# Patient Record
Sex: Female | Born: 1985 | Race: Black or African American | Hispanic: No | Marital: Single | Smoking: Current every day smoker
Health system: Southern US, Community
[De-identification: ages and names within clinical notes are randomized; demographics above are authoritative.]

## PROBLEM LIST (undated history)

## (undated) DIAGNOSIS — I10 Essential (primary) hypertension: Secondary | ICD-10-CM

---

## 2012-04-18 ENCOUNTER — Encounter (HOSPITAL_BASED_OUTPATIENT_CLINIC_OR_DEPARTMENT_OTHER): Payer: Self-pay | Admitting: *Deleted

## 2012-04-18 ENCOUNTER — Emergency Department (HOSPITAL_BASED_OUTPATIENT_CLINIC_OR_DEPARTMENT_OTHER)
Admission: EM | Admit: 2012-04-18 | Discharge: 2012-04-18 | Discharge status: Against Medical Advice | Payer: Self-pay | Attending: Emergency Medicine | Admitting: Emergency Medicine

## 2012-04-18 DIAGNOSIS — R51 Headache: Secondary | ICD-10-CM | POA: Insufficient documentation

## 2012-04-18 DIAGNOSIS — R509 Fever, unspecified: Secondary | ICD-10-CM | POA: Insufficient documentation

## 2012-04-18 DIAGNOSIS — R05 Cough: Secondary | ICD-10-CM | POA: Insufficient documentation

## 2012-04-18 DIAGNOSIS — R059 Cough, unspecified: Secondary | ICD-10-CM | POA: Insufficient documentation

## 2012-04-18 DIAGNOSIS — IMO0001 Reserved for inherently not codable concepts without codable children: Secondary | ICD-10-CM | POA: Insufficient documentation

## 2012-04-18 HISTORY — DX: Essential (primary) hypertension: I10

## 2012-04-18 NOTE — ED Notes (Signed)
Patient states she has had cold symptoms for one week, developed generalized bodyache, headache, fever and productive cough with dark secretions.

## 2012-04-18 NOTE — ED Notes (Signed)
No answer in waiting room 

## 2012-04-19 ENCOUNTER — Encounter (HOSPITAL_BASED_OUTPATIENT_CLINIC_OR_DEPARTMENT_OTHER): Payer: Self-pay | Admitting: Emergency Medicine

## 2012-04-19 ENCOUNTER — Emergency Department (HOSPITAL_BASED_OUTPATIENT_CLINIC_OR_DEPARTMENT_OTHER)
Admission: EM | Admit: 2012-04-19 | Discharge: 2012-04-19 | Disposition: A | Discharge status: Home / Self Care | Payer: Self-pay | Attending: Emergency Medicine | Admitting: Emergency Medicine

## 2012-04-19 ENCOUNTER — Emergency Department (HOSPITAL_BASED_OUTPATIENT_CLINIC_OR_DEPARTMENT_OTHER): Payer: Self-pay

## 2012-04-19 DIAGNOSIS — I10 Essential (primary) hypertension: Secondary | ICD-10-CM | POA: Insufficient documentation

## 2012-04-19 DIAGNOSIS — J3489 Other specified disorders of nose and nasal sinuses: Secondary | ICD-10-CM | POA: Insufficient documentation

## 2012-04-19 DIAGNOSIS — R059 Cough, unspecified: Secondary | ICD-10-CM | POA: Insufficient documentation

## 2012-04-19 DIAGNOSIS — R509 Fever, unspecified: Secondary | ICD-10-CM | POA: Insufficient documentation

## 2012-04-19 DIAGNOSIS — R5381 Other malaise: Secondary | ICD-10-CM | POA: Insufficient documentation

## 2012-04-19 DIAGNOSIS — R042 Hemoptysis: Secondary | ICD-10-CM | POA: Insufficient documentation

## 2012-04-19 DIAGNOSIS — R197 Diarrhea, unspecified: Secondary | ICD-10-CM | POA: Insufficient documentation

## 2012-04-19 DIAGNOSIS — J029 Acute pharyngitis, unspecified: Secondary | ICD-10-CM | POA: Insufficient documentation

## 2012-04-19 DIAGNOSIS — Z3202 Encounter for pregnancy test, result negative: Secondary | ICD-10-CM | POA: Insufficient documentation

## 2012-04-19 DIAGNOSIS — R05 Cough: Secondary | ICD-10-CM | POA: Insufficient documentation

## 2012-04-19 DIAGNOSIS — J111 Influenza due to unidentified influenza virus with other respiratory manifestations: Secondary | ICD-10-CM

## 2012-04-19 DIAGNOSIS — IMO0001 Reserved for inherently not codable concepts without codable children: Secondary | ICD-10-CM | POA: Insufficient documentation

## 2012-04-19 LAB — URINALYSIS, ROUTINE W REFLEX MICROSCOPIC
Glucose, UA: NEGATIVE mg/dL
Ketones, ur: NEGATIVE mg/dL
Leukocytes, UA: NEGATIVE
Protein, ur: NEGATIVE mg/dL

## 2012-04-19 MED ORDER — ALBUTEROL SULFATE HFA 108 (90 BASE) MCG/ACT IN AERS
2.0000 | INHALATION_SPRAY | RESPIRATORY_TRACT | Status: DC | PRN
  Administered 2012-04-19: 2 via RESPIRATORY_TRACT
  Filled 2012-04-19: qty 6.7

## 2012-04-19 MED ORDER — HYDROCOD POLST-CHLORPHEN POLST 10-8 MG/5ML PO LQCR
5.0000 mL | Freq: Once | ORAL | Status: AC
  Administered 2012-04-19: 5 mL via ORAL
  Filled 2012-04-19: qty 5

## 2012-04-19 MED ORDER — HYDROCOD POLST-CHLORPHEN POLST 10-8 MG/5ML PO LQCR: 5.0000 mL | Freq: Two times a day (BID) | ORAL | Status: DC | PRN

## 2012-04-19 NOTE — ED Notes (Signed)
Prescription called in to [facility] at [phone] for dextromethorphan 10mg  po q4h prn, no refills per [employer], pa-c

## 2012-04-19 NOTE — ED Notes (Signed)
Body aches  Cough  fever

## 2012-04-19 NOTE — ED Provider Notes (Signed)
History     CSN: [id number]  Arrival date & time 04/19/12  0035   First MD Initiated Contact with Patient 04/19/12 0049      Chief Complaint  Patient presents with  . Fever    (Consider location/radiation/quality/duration/timing/severity/associated sxs/prior treatment) HPI This is 27 year old female who developed cold symptoms about a week and a half ago. These symptoms included cough, nasal congestion and scratchy throat.  Her symptoms improved but over the last 2 days she has developed fever as high as 101, generalized body aches which been severe at times, increased cough productive of clear sputum and occasionally blood-streaked sputum, and general malaise. She's been taking over-the-counter medications without relief. She's had diarrhea but no nausea or vomiting.   Past Medical History  Diagnosis Date  . Hypertension     History reviewed. No pertinent past surgical history.  No family history on file.  History  Substance Use Topics  . Smoking status: Never Smoker   . Smokeless tobacco: Not on file  . Alcohol Use: Yes     Comment: occassionally    OB History    Grav Para Term Preterm Abortions TAB SAB Ect Mult Living                  Review of Systems  All other systems reviewed and are negative.    Allergies  Review of patient's allergies indicates no known allergies.  Home Medications  No current outpatient prescriptions on file.  LMP 03/18/2012  Physical Exam General: Well-developed, well-nourished female in no acute distress; appearance consistent with age of record HENT: normocephalic, atraumatic; nasal congestion; no pharyngeal erythema or exudate Eyes: pupils equal round and reactive to light; extraocular muscles intact Neck: supple Heart: regular rate and rhythm; tachycardia Lungs: clear to auscultation bilaterally; paroxysms of cough, particularly when attempting a deep breath Abdomen: soft; nondistended Extremities: No deformity; full range  of motion Neurologic: Awake, alert and oriented; motor function intact in all extremities and symmetric; no facial droop Skin: Warm and dry Psychiatric: Flat affect    ED Course  Procedures (including critical care time)     MDM  Nursing notes and vitals signs, including pulse oximetry, reviewed.  Summary of this visit's results, reviewed by myself:  Imaging Studies: Dg Chest 2 View  04/19/2012  *RADIOLOGY REPORT*  Clinical Data: Cough, chest pain, hypertension.  CHEST - 2 VIEW  Comparison: None.  Findings: Lungs clear.  Heart size and pulmonary vascularity normal.  No effusion.  Visualized bones unremarkable.  No pneumothorax.  IMPRESSION: No acute disease   Original Report Authenticated By: D. Andria Rhein, MD             Hanley Seamen, MD 04/19/12 [phone]

## 2012-07-27 ENCOUNTER — Emergency Department (HOSPITAL_BASED_OUTPATIENT_CLINIC_OR_DEPARTMENT_OTHER)
Admission: EM | Admit: 2012-07-27 | Discharge: 2012-07-27 | Disposition: A | Discharge status: Home / Self Care | Payer: Self-pay | Attending: Emergency Medicine | Admitting: Emergency Medicine

## 2012-07-27 ENCOUNTER — Encounter (HOSPITAL_BASED_OUTPATIENT_CLINIC_OR_DEPARTMENT_OTHER): Payer: Self-pay | Admitting: *Deleted

## 2012-07-27 DIAGNOSIS — I10 Essential (primary) hypertension: Secondary | ICD-10-CM | POA: Insufficient documentation

## 2012-07-27 DIAGNOSIS — Y939 Activity, unspecified: Secondary | ICD-10-CM | POA: Insufficient documentation

## 2012-07-27 DIAGNOSIS — S025XXA Fracture of tooth (traumatic), initial encounter for closed fracture: Secondary | ICD-10-CM | POA: Insufficient documentation

## 2012-07-27 DIAGNOSIS — X58XXXA Exposure to other specified factors, initial encounter: Secondary | ICD-10-CM | POA: Insufficient documentation

## 2012-07-27 DIAGNOSIS — K0889 Other specified disorders of teeth and supporting structures: Secondary | ICD-10-CM

## 2012-07-27 DIAGNOSIS — Y929 Unspecified place or not applicable: Secondary | ICD-10-CM | POA: Insufficient documentation

## 2012-07-27 DIAGNOSIS — R6884 Jaw pain: Secondary | ICD-10-CM | POA: Insufficient documentation

## 2012-07-27 DIAGNOSIS — R221 Localized swelling, mass and lump, neck: Secondary | ICD-10-CM | POA: Insufficient documentation

## 2012-07-27 DIAGNOSIS — R220 Localized swelling, mass and lump, head: Secondary | ICD-10-CM | POA: Insufficient documentation

## 2012-07-27 MED ORDER — HYDROCODONE-ACETAMINOPHEN 5-325 MG PO TABS: 2.0000 | ORAL_TABLET | ORAL | Status: DC | PRN

## 2012-07-27 MED ORDER — AMOXICILLIN 500 MG PO CAPS: 500.0000 mg | ORAL_CAPSULE | Freq: Three times a day (TID) | ORAL | Status: DC

## 2012-07-27 NOTE — ED Notes (Signed)
PA at bedside.

## 2012-07-27 NOTE — ED Provider Notes (Signed)
History     CSN: [id number]  Arrival date & time 07/27/12  1230   First MD Initiated Contact with Patient 07/27/12 1320      Chief Complaint  Patient presents with  . Dental Pain    (Consider location/radiation/quality/duration/timing/severity/associated sxs/prior treatment) Patient is a 27 y.o. female presenting with tooth pain. The history is provided by the patient. No language interpreter was used.  Dental PainThe primary symptoms include mouth pain and dental injury. The symptoms began more than 1 week ago. The symptoms are worsening. The symptoms are new. The symptoms occur constantly.  Additional symptoms include: jaw pain and facial swelling.    Past Medical History  Diagnosis Date  . Hypertension     History reviewed. No pertinent past surgical history.  History reviewed. No pertinent family history.  History  Substance Use Topics  . Smoking status: Never Smoker   . Smokeless tobacco: Not on file  . Alcohol Use: Yes     Comment: occassionally    OB History   Grav Para Term Preterm Abortions TAB SAB Ect Mult Living                  Review of Systems  HENT: Positive for facial swelling and dental problem.   All other systems reviewed and are negative.    Allergies  Review of patient's allergies indicates no known allergies.  Home Medications   Current Outpatient Rx  Name  Route  Sig  Dispense  Refill  . amoxicillin (AMOXIL) 500 MG capsule   Oral   Take 1 capsule (500 mg total) by mouth 3 (three) times daily.   21 capsule   0   . chlorpheniramine-HYDROcodone (TUSSIONEX PENNKINETIC ER) 10-8 MG/5ML LQCR   Oral   Take 5 mLs by mouth every 12 (twelve) hours as needed (for cough).   115 mL   0   . HYDROcodone-acetaminophen (NORCO/VICODIN) 5-325 MG per tablet   Oral   Take 2 tablets by mouth every 4 (four) hours as needed.   16 tablet   0     BP 125/86  Pulse 84  Temp(Src) 98.5 F (36.9 C) (Oral)  Resp 18  Ht 5\' 3"  (1.6 m)  Wt 135 lb  (61.236 kg)  BMI 23.92 kg/m2  SpO2 100%  LMP 06/18/2012  Physical Exam  Nursing note and vitals reviewed. Constitutional: She appears well-developed and well-nourished.  HENT:  Head: Normocephalic.  Swollen gum right lower, broken tooth  Eyes: Pupils are equal, round, and reactive to light.  Cardiovascular: Normal rate.   Pulmonary/Chest: Effort normal.  Musculoskeletal: Normal range of motion.  Neurological: She is alert.  Skin: Skin is warm.  Psychiatric: She has a normal mood and affect.    ED Course  Procedures (including critical care time)  Labs Reviewed - No data to display No results found.   1. Toothache       MDM  amoxicillian and hydrocodone        Elson Areas, [place] 07/27/12 1349

## 2012-07-27 NOTE — ED Notes (Signed)
Pt c/o toothache x 1 week.

## 2012-07-28 NOTE — ED Provider Notes (Signed)
Medical screening examination/treatment/procedure(s) were performed by non-physician practitioner and as supervising physician I was immediately available for consultation/collaboration.   Shelda Jakes, MD 07/28/12 [phone]

## 2015-02-09 ENCOUNTER — Encounter (HOSPITAL_BASED_OUTPATIENT_CLINIC_OR_DEPARTMENT_OTHER): Payer: Self-pay | Admitting: Emergency Medicine

## 2015-02-09 ENCOUNTER — Emergency Department (HOSPITAL_BASED_OUTPATIENT_CLINIC_OR_DEPARTMENT_OTHER)
Admission: EM | Admit: 2015-02-09 | Discharge: 2015-02-09 | Disposition: A | Discharge status: Home / Self Care | Payer: Self-pay | Attending: Emergency Medicine | Admitting: Emergency Medicine

## 2015-02-09 DIAGNOSIS — I10 Essential (primary) hypertension: Secondary | ICD-10-CM | POA: Insufficient documentation

## 2015-02-09 DIAGNOSIS — L509 Urticaria, unspecified: Secondary | ICD-10-CM | POA: Insufficient documentation

## 2015-02-09 DIAGNOSIS — Z720 Tobacco use: Secondary | ICD-10-CM | POA: Insufficient documentation

## 2015-02-09 MED ORDER — PREDNISONE 50 MG PO TABS
60.0000 mg | ORAL_TABLET | Freq: Once | ORAL | Status: AC
  Administered 2015-02-09: 60 mg via ORAL
  Filled 2015-02-09 (×2): qty 1

## 2015-02-09 MED ORDER — DIPHENHYDRAMINE HCL 25 MG PO TABS: 25.0000 mg | ORAL_TABLET | Freq: Four times a day (QID) | ORAL | Status: DC

## 2015-02-09 MED ORDER — DIPHENHYDRAMINE HCL 25 MG PO CAPS
25.0000 mg | ORAL_CAPSULE | Freq: Once | ORAL | Status: AC
  Administered 2015-02-09: 25 mg via ORAL
  Filled 2015-02-09: qty 1

## 2015-02-09 MED ORDER — PREDNISONE 10 MG PO TABS: 20.0000 mg | ORAL_TABLET | Freq: Every day | ORAL | Status: DC

## 2015-02-09 NOTE — Discharge Instructions (Signed)
Hives Hives are itchy, red, swollen areas of the skin. They can vary in size and location on your body. Hives can come and go for hours or several days (acute hives) or for several weeks (chronic hives). Hives do not spread from person to person (noncontagious). They may get worse with scratching, exercise, and emotional stress. CAUSES   Allergic reaction to food, additives, or drugs.  Infections, including the common cold.  Illness, such as vasculitis, lupus, or thyroid disease.  Exposure to sunlight, heat, or cold.  Exercise.  Stress.  Contact with chemicals. SYMPTOMS   Red or white swollen patches on the skin. The patches may change size, shape, and location quickly and repeatedly.  Itching.  Swelling of the hands, feet, and face. This may occur if hives develop deeper in the skin. DIAGNOSIS  Your caregiver can usually tell what is wrong by performing a physical exam. Skin or blood tests may also be done to determine the cause of your hives. In some cases, the cause cannot be determined. TREATMENT  Mild cases usually get better with medicines such as antihistamines. Severe cases may require an emergency epinephrine injection. If the cause of your hives is known, treatment includes avoiding that trigger.  HOME CARE INSTRUCTIONS   Avoid causes that trigger your hives.  Take antihistamines as directed by your caregiver to reduce the severity of your hives. Non-sedating or low-sedating antihistamines are usually recommended. Do not drive while taking an antihistamine.  Take any other medicines prescribed for itching as directed by your caregiver.  Wear loose-fitting clothing.  Keep all follow-up appointments as directed by your caregiver. SEEK MEDICAL CARE IF:   You have persistent or severe itching that is not relieved with medicine.  You have painful or swollen joints. SEEK IMMEDIATE MEDICAL CARE IF:   You have a fever.  Your tongue or lips are swollen.  You have  trouble breathing or swallowing.  You feel tightness in the throat or chest.  You have abdominal pain. These problems may be the first sign of a life-threatening allergic reaction. Call your local emergency services (911 in [place] MAKE SURE YOU:   Understand these instructions.  Will watch your condition.  Will get help right away if you are not doing well or get worse.   This information is not intended to replace advice given to you by your health care provider. Make sure you discuss any questions you have with your health care provider.   Document Released: 03/20/2005 Document Revised: 03/25/2013 Document Reviewed: 06/13/2011 Elsevier Interactive Patient Education 2016 [employer]  

## 2015-02-09 NOTE — ED Notes (Signed)
Rash   Onset yesterday

## 2015-02-09 NOTE — ED Provider Notes (Signed)
CSN: [id number]     Arrival date & time 02/09/15  0800 History   First MD Initiated Contact with Patient 02/09/15 [phone]     Chief Complaint  Patient presents with  . Rash     (Consider location/radiation/quality/duration/timing/severity/associated sxs/prior Treatment) HPI  29 year old female presents today complaining of rash that began yesterday morning. She describes it as red, raised, and itchy. It began on her lower extremities but has migrated and includes her arms and some of her trunk now. It is migrating in appearance. There is no mucous membrane involvement.  Past Medical History  Diagnosis Date  . Hypertension    History reviewed. No pertinent past surgical history. No family history on file. Social History  Substance Use Topics  . Smoking status: Light Tobacco Smoker  . Smokeless tobacco: None  . Alcohol Use: Yes     Comment: occassionally   OB History    No data available     Review of Systems  All other systems reviewed and are negative.     Allergies  Review of patient's allergies indicates no known allergies.  Home Medications   Prior to Admission medications   Medication Sig Start Date End Date Taking? Authorizing Provider  diphenhydrAMINE (BENADRYL) 25 MG tablet Take 1 tablet (25 mg total) by mouth every 6 (six) hours. 02/09/15   Margarita Grizzleanielle Florie Carico, MD  predniSONE (DELTASONE) 10 MG tablet Take 2 tablets (20 mg total) by mouth daily. 02/09/15   Margarita Grizzleanielle Taneesha Edgin, MD   BP 133/94 mmHg  Pulse 110  Temp(Src) 98.9 F (37.2 C) (Oral)  Resp 18  Ht 5\' 3"  (1.6 m)  Wt 153 lb 1.6 oz (69.446 kg)  BMI 27.13 kg/m2  SpO2 100% Physical Exam  Constitutional: She is oriented to person, place, and time. She appears well-developed and well-nourished.  HENT:  Head: Normocephalic and atraumatic.  Right Ear: External ear normal.  Left Ear: External ear normal.  Nose: Nose normal.  Mouth/Throat: Oropharynx is clear and moist.  Eyes: Pupils are equal, round, and reactive to  light.  Neck: Normal range of motion.  Cardiovascular: Normal rate and regular rhythm.   Pulmonary/Chest: Effort normal and breath sounds normal.  Abdominal: Soft.  Musculoskeletal: Normal range of motion. She exhibits no edema or tenderness.  Neurological: She is alert and oriented to person, place, and time. She has normal reflexes.  Skin: Skin is warm. Rash noted. Rash is urticarial.     Nursing note and vitals reviewed.   ED Course  Procedures (including critical care time) Labs Review Labs Reviewed - No data to display  Imaging Review No results found. I have personally reviewed and evaluated these images and lab results as part of my medical decision-making.   EKG Interpretation None      MDM   Final diagnoses:  Hives    Patient with exam and history consistent with hives. She has no apparent involvement of other symptoms such as GI or respiratory. She has had some similar symptoms in the past and has not been able to figure out the etiology. She normally takes Benadryl with relief. Has not taken any this time. I discussed with her that she should use Benadryl and I'm placing her on prednisone. She is given referral for primary care and allergy and asthma. She voices understanding of return precautions and need for follow-up.    Margarita Grizzleanielle Michaila Kenney, MD 02/09/15 [phone]

## 2016-10-13 ENCOUNTER — Emergency Department (HOSPITAL_COMMUNITY)
Admission: EM | Admit: 2016-10-13 | Discharge: 2016-10-13 | Disposition: A | Discharge status: Home / Self Care | Payer: Self-pay | Attending: Emergency Medicine | Admitting: Emergency Medicine

## 2016-10-13 ENCOUNTER — Encounter (HOSPITAL_COMMUNITY): Payer: Self-pay | Admitting: Emergency Medicine

## 2016-10-13 ENCOUNTER — Emergency Department (HOSPITAL_COMMUNITY): Payer: Self-pay

## 2016-10-13 DIAGNOSIS — R102 Pelvic and perineal pain: Secondary | ICD-10-CM | POA: Insufficient documentation

## 2016-10-13 DIAGNOSIS — N83209 Unspecified ovarian cyst, unspecified side: Secondary | ICD-10-CM

## 2016-10-13 DIAGNOSIS — A599 Trichomoniasis, unspecified: Secondary | ICD-10-CM

## 2016-10-13 DIAGNOSIS — N730 Acute parametritis and pelvic cellulitis: Secondary | ICD-10-CM

## 2016-10-13 DIAGNOSIS — N83202 Unspecified ovarian cyst, left side: Secondary | ICD-10-CM | POA: Insufficient documentation

## 2016-10-13 DIAGNOSIS — F17210 Nicotine dependence, cigarettes, uncomplicated: Secondary | ICD-10-CM | POA: Insufficient documentation

## 2016-10-13 LAB — COMPREHENSIVE METABOLIC PANEL
ALBUMIN: 4.7 g/dL (ref 3.5–5.0)
ALT: 14 U/L (ref 14–54)
ANION GAP: 9 (ref 5–15)
AST: 20 U/L (ref 15–41)
Alkaline Phosphatase: 66 U/L (ref 38–126)
BUN: 9 mg/dL (ref 6–20)
CO2: 23 mmol/L (ref 22–32)
Calcium: 9.4 mg/dL (ref 8.9–10.3)
Chloride: 106 mmol/L (ref 101–111)
Creatinine, Ser: 0.77 mg/dL (ref 0.44–1.00)
GFR calc Af Amer: >60 mL/min (ref >60)
GFR calc non Af Amer: >60 mL/min (ref >60)
GLUCOSE: 87 mg/dL (ref 65–99)
POTASSIUM: 3.4 mmol/L — AB (ref 3.5–5.1)
SODIUM: 138 mmol/L (ref 135–145)
TOTAL PROTEIN: 8 g/dL (ref 6.5–8.1)
Total Bilirubin: 0.5 mg/dL (ref 0.3–1.2)

## 2016-10-13 LAB — CBC
HEMATOCRIT: 40.1 % (ref 36.0–46.0)
HEMOGLOBIN: 14.1 g/dL (ref 12.0–15.0)
MCH: 28.1 pg (ref 26.0–34.0)
MCHC: 35.2 g/dL (ref 30.0–36.0)
MCV: 80 fL (ref 78.0–100.0)
Platelets: 205 10*3/uL (ref 150–400)
RBC: 5.01 MIL/uL (ref 3.87–5.11)
RDW: 13.3 % (ref 11.5–15.5)
WBC: 10.7 10*3/uL — ABNORMAL HIGH (ref 4.0–10.5)

## 2016-10-13 LAB — WET PREP, GENITAL
Sperm: NONE SEEN
Yeast Wet Prep HPF POC: NONE SEEN

## 2016-10-13 LAB — RAPID HIV SCREEN (HIV 1/2 AB+AG)
HIV 1/2 ANTIBODIES: NONREACTIVE
HIV-1 P24 ANTIGEN - HIV24: NONREACTIVE

## 2016-10-13 LAB — URINALYSIS, ROUTINE W REFLEX MICROSCOPIC
BACTERIA UA: NONE SEEN
Bilirubin Urine: NEGATIVE
Glucose, UA: NEGATIVE mg/dL
HGB URINE DIPSTICK: NEGATIVE
KETONES UR: 5 mg/dL — AB
NITRITE: NEGATIVE
PROTEIN: NEGATIVE mg/dL
Specific Gravity, Urine: 1.006 (ref 1.005–1.030)
pH: 7 (ref 5.0–8.0)

## 2016-10-13 LAB — I-STAT BETA HCG BLOOD, ED (MC, WL, AP ONLY)

## 2016-10-13 LAB — LIPASE, BLOOD: LIPASE: 14 U/L (ref 11–51)

## 2016-10-13 MED ORDER — DOXYCYCLINE HYCLATE 100 MG PO TABS
100.0000 mg | ORAL_TABLET | Freq: Once | ORAL | Status: AC
Start: 2016-10-13 — End: 2016-10-13
  Administered 2016-10-13: 100 mg via ORAL
  Filled 2016-10-13: qty 1

## 2016-10-13 MED ORDER — KETOROLAC TROMETHAMINE 15 MG/ML IJ SOLN
15.0000 mg | Freq: Once | INTRAMUSCULAR | Status: AC
  Administered 2016-10-13: 15 mg via INTRAVENOUS
  Filled 2016-10-13: qty 1

## 2016-10-13 MED ORDER — DOXYCYCLINE HYCLATE 100 MG PO CAPS: 100.0000 mg | ORAL_CAPSULE | Freq: Two times a day (BID) | ORAL | 0 refills | Status: AC

## 2016-10-13 MED ORDER — NAPROXEN 500 MG PO TABS: 500.0000 mg | ORAL_TABLET | Freq: Two times a day (BID) | ORAL | 0 refills | Status: AC | PRN

## 2016-10-13 MED ORDER — HYDROCODONE-ACETAMINOPHEN 5-325 MG PO TABS
2.0000 | ORAL_TABLET | Freq: Once | ORAL | Status: AC
  Administered 2016-10-13: 2 via ORAL
  Filled 2016-10-13: qty 2

## 2016-10-13 MED ORDER — FLUCONAZOLE 150 MG PO TABS: 150.0000 mg | ORAL_TABLET | Freq: Every day | ORAL | 0 refills | Status: AC

## 2016-10-13 MED ORDER — DEXTROSE 5 % IV SOLN
1.0000 g | Freq: Once | INTRAVENOUS | Status: AC
  Administered 2016-10-13: 1 g via INTRAVENOUS
  Filled 2016-10-13: qty 10

## 2016-10-13 MED ORDER — MORPHINE SULFATE (PF) 4 MG/ML IV SOLN
6.0000 mg | Freq: Once | INTRAVENOUS | Status: AC
  Administered 2016-10-13: 6 mg via INTRAVENOUS
  Filled 2016-10-13: qty 2

## 2016-10-13 MED ORDER — METRONIDAZOLE 500 MG PO TABS
2000.0000 mg | ORAL_TABLET | Freq: Once | ORAL | Status: AC
  Administered 2016-10-13: 2000 mg via ORAL
  Filled 2016-10-13: qty 4

## 2016-10-13 MED ORDER — SODIUM CHLORIDE 0.9 % IV BOLUS (SEPSIS)
1000.0000 mL | Freq: Once | INTRAVENOUS | Status: AC
  Administered 2016-10-13: 1000 mL via INTRAVENOUS

## 2016-10-13 MED ORDER — HYDROCODONE-ACETAMINOPHEN 5-325 MG PO TABS
1.0000 | ORAL_TABLET | Freq: Four times a day (QID) | ORAL | 0 refills | Status: AC | PRN
Start: 2016-10-13 — End: ?

## 2016-10-13 MED ORDER — ONDANSETRON HCL 4 MG/2ML IJ SOLN
4.0000 mg | Freq: Once | INTRAMUSCULAR | Status: AC
  Administered 2016-10-13: 4 mg via INTRAVENOUS
  Filled 2016-10-13: qty 2

## 2016-10-13 NOTE — ED Provider Notes (Signed)
[facility]-EMERGENCY DEPT Provider Note   CSN: [id number] Arrival date & time: 10/13/16  0848     History   Chief Complaint Chief Complaint  Patient presents with  . Abdominal Pain    HPI Debbie Baxter is a 31 y.o. female.  HPI   31 yo F with PMHx HTN, ovarian cysts here with severe lower abdominal pain. Pt was in her usual state of health on awakening this AM. While getting ready for work, she developed acute onset of sharp, severe, lower abdominal pain with radiation to her vagina and rectum. She has had no vaginal bleeding or discharge prior to or since the onset of pain. No dysuria, frequency, or hematuria. The pain is midline primarily, worse with any movement or palpation. No alleviating factors. Denies recent high risk sexual activity. Her LMP ended 2 weeks ago.  Past Medical History:  Diagnosis Date  . Hypertension     There are no active problems to display for this patient.   History reviewed. No pertinent surgical history.  OB History    No data available       Home Medications    Prior to Admission medications   Medication Sig Start Date End Date Taking? Authorizing Provider  Biotin 1000 MCG CHEW Chew 2 each by mouth daily.   Yes [provider]  doxycycline (VIBRAMYCIN) 100 MG capsule Take 1 capsule (100 mg total) by mouth 2 (two) times daily. 10/13/16 10/27/16  Shaune PollackIsaacs, Mazzie Brodrick, MD  fluconazole (DIFLUCAN) 150 MG tablet Take 1 tablet (150 mg total) by mouth daily. For yeast infection, after finishing antibiotics 10/13/16 10/14/16  Shaune PollackIsaacs, Richardo Popoff, MD  HYDROcodone-acetaminophen (NORCO/VICODIN) 5-325 MG tablet Take 1-2 tablets by mouth every 6 (six) hours as needed. 10/13/16   Shaune PollackIsaacs, [place] Depaz, MD  naproxen (NAPROSYN) 500 MG tablet Take 1 tablet (500 mg total) by mouth 2 (two) times daily as needed for moderate pain. 10/13/16 10/20/16  Shaune PollackIsaacs, Ashleigh Luckow, MD    Family History History reviewed. No pertinent family history.  Social History Social History    Substance Use Topics  . Smoking status: Light Tobacco Smoker  . Smokeless tobacco: Not on file  . Alcohol use Yes     Comment: occassionally     Allergies   Patient has no known allergies.   Review of Systems Review of Systems  Constitutional: Positive for fatigue.  Gastrointestinal: Positive for abdominal pain and nausea.  Genitourinary: Positive for pelvic pain and vaginal pain.  All other systems reviewed and are negative.    Physical Exam Updated Vital Signs BP 122/87 (BP Location: Left Arm)   Pulse 87   Temp 98.9 F (37.2 C) (Oral)   Resp 16   SpO2 99%   Physical Exam  Constitutional: She is oriented to person, place, and time. She appears well-developed and well-nourished. She appears distressed.  Tearful, in pain  HENT:  Head: Normocephalic and atraumatic.  Eyes: Conjunctivae are normal.  Neck: Neck supple.  Cardiovascular: Regular rhythm and normal heart sounds.  Tachycardia present.  Exam reveals no friction rub.   No murmur heard. Pulmonary/Chest: Effort normal and breath sounds normal. No respiratory distress. She has no wheezes. She has no rales.  Abdominal: She exhibits no distension. There is tenderness in the suprapubic area. There is guarding. There is no rigidity, no rebound, no tenderness at McBurney's point and negative Murphy's sign.  Genitourinary:  Genitourinary Comments: Moderate volume white discharge in vaginal vault. Os closed, with significant CMT. Diffuse suprapubic TTP but no overt adnexal TTP  or fullness.  Musculoskeletal: She exhibits no edema.  Neurological: She is alert and oriented to person, place, and time. She exhibits normal muscle tone.  Skin: Skin is warm. Capillary refill takes less than 2 seconds.  Psychiatric: She has a normal mood and affect.  Nursing note and vitals reviewed.    ED Treatments / Results  Labs (all labs ordered are listed, but only abnormal results are displayed) Labs Reviewed  WET PREP, GENITAL -  Abnormal; Notable for the following:       Result Value   Trich, Wet Prep PRESENT (*)    Clue Cells Wet Prep HPF POC PRESENT (*)    WBC, Wet Prep HPF POC FEW (*)    All other components within normal limits  COMPREHENSIVE METABOLIC PANEL - Abnormal; Notable for the following:    Potassium 3.4 (*)    All other components within normal limits  CBC - Abnormal; Notable for the following:    WBC 10.7 (*)    All other components within normal limits  URINALYSIS, ROUTINE W REFLEX MICROSCOPIC - Abnormal; Notable for the following:    Ketones, ur 5 (*)    Leukocytes, UA MODERATE (*)    Squamous Epithelial / LPF 0-5 (*)    All other components within normal limits  LIPASE, BLOOD  RAPID HIV SCREEN (HIV 1/2 AB+AG)  HIV ANTIBODY (ROUTINE TESTING)  I-STAT BETA HCG BLOOD, ED ([facility], [facility], [facility] ONLY)  GC/CHLAMYDIA PROBE AMP ([facility]) NOT AT [facility]    EKG  EKG Interpretation None       Radiology US Transvaginal Non-ob  Result Date: 10/13/2016 CLINICAL DATA:  Severe lower abdominal pain, pelvic pain EXAM: TRANSABDOMINAL AND TRANSVAGINAL ULTRASOUND OF PELVIS TECHNIQUE: Both transabdominal and transvaginal ultrasound examinations of the pelvis were performed. Transabdominal technique was performed for global imaging of the pelvis including uterus, ovaries, adnexal regions, and pelvic cul-de-sac. It was necessary to proceed with endovaginal exam following the transabdominal exam to visualize the uterus, endometrium, ovaries and adnexa . COMPARISON:  None FINDINGS: Uterus Measurements: 7.9 x 3.4 x 5.3 cm. No fibroids or other mass visualized. Endometrium Thickness: 12 mm in thickness.  No focal abnormality visualized. Right ovary Measurements: 2.6 x 2.1 x 2.3 cm. Normal appearance/no adnexal mass. Multiple small follicles. Left ovary Measurements: 4.6 x 2.3 x 3.0 cm complex cystic area within the left ovary measures up to 2.8 cm, likely small hemorrhagic cyst or follicle. Other findings Small amount of free  fluid. IMPRESSION: Small hemorrhagic sister follicle in the left ovary. No acute findings or significant abnormality. Electronically Signed   By: Charlett Nose M.D.   On: 10/13/2016 11:59   US Pelvis Complete  Result Date: 10/13/2016 CLINICAL DATA:  Severe lower abdominal pain, pelvic pain EXAM: TRANSABDOMINAL AND TRANSVAGINAL ULTRASOUND OF PELVIS TECHNIQUE: Both transabdominal and transvaginal ultrasound examinations of the pelvis were performed. Transabdominal technique was performed for global imaging of the pelvis including uterus, ovaries, adnexal regions, and pelvic cul-de-sac. It was necessary to proceed with endovaginal exam following the transabdominal exam to visualize the uterus, endometrium, ovaries and adnexa . COMPARISON:  None FINDINGS: Uterus Measurements: 7.9 x 3.4 x 5.3 cm. No fibroids or other mass visualized. Endometrium Thickness: 12 mm in thickness.  No focal abnormality visualized. Right ovary Measurements: 2.6 x 2.1 x 2.3 cm. Normal appearance/no adnexal mass. Multiple small follicles. Left ovary Measurements: 4.6 x 2.3 x 3.0 cm complex cystic area within the left ovary measures up to 2.8 cm, likely small hemorrhagic cyst  or follicle. Other findings Small amount of free fluid. IMPRESSION: Small hemorrhagic sister follicle in the left ovary. No acute findings or significant abnormality. Electronically Signed   By: Charlett Nose M.D.   On: 10/13/2016 11:59   [place] Art/ven Flow Abd Pelv Doppler  Result Date: 10/13/2016 CLINICAL DATA:  Acute pain.  LMP 09/27/2016. EXAM: TRANSABDOMINAL AND TRANSVAGINAL ULTRASOUND OF PELVIS TECHNIQUE: Both transabdominal and transvaginal ultrasound examinations of the pelvis were performed. Transabdominal technique was performed for global imaging of the pelvis including uterus, ovaries, adnexal regions, and pelvic cul-de-sac. It was necessary to proceed with endovaginal exam following the transabdominal exam to visualize the uterus, endometrium, ovaries,  adnexal region. COMPARISON:  None FINDINGS: Uterus Measurements: 7.9 x 3.4 x 5.3 cm. No fibroids or other mass visualized. Endometrium Thickness: 11.4 mm. No focal abnormality visualized. Trilaminar appearance. Right ovary Measurements: 2.6 x 2.1 x 2.3 cm. Normal appearance/no adnexal mass. Left ovary Measurements: 4.6 x 2.3 x 3.0 cm. A cyst contains internal septations and faint internal echoes and measures 2.8 x 1.7 x 2.4 cm. Other findings There is a small amount of free pelvic fluid. IMPRESSION: 1. Normal appearance of the uterus/endometrium. 2. Minimally complex cyst within the left ovary has a benign appearance and may account for the patient's symptoms of acute pain. Follow-up pelvic ultrasound is recommended in 8-12 weeks. 3. Normal appearance of the right ovary. Electronically Signed   By: Norva Pavlov M.D.   On: 10/13/2016 12:05    Procedures Procedures (including critical care time)  Medications Ordered in ED Medications  sodium chloride 0.9 % bolus 1,000 mL (0 mLs Intravenous Stopped 10/13/16 1301)  morphine 4 MG/ML injection 6 mg (6 mg Intravenous Given 10/13/16 1103)  ondansetron (ZOFRAN) injection 4 mg (4 mg Intravenous Given 10/13/16 1103)  ketorolac (TORADOL) 15 MG/ML injection 15 mg (15 mg Intravenous Given 10/13/16 1104)  cefTRIAXone (ROCEPHIN) 1 g in dextrose 5 % 50 mL IVPB (0 g Intravenous Stopped 10/13/16 1334)  doxycycline (VIBRA-TABS) tablet 100 mg (100 mg Oral Given 10/13/16 1301)  HYDROcodone-acetaminophen (NORCO/VICODIN) 5-325 MG per tablet 2 tablet (2 tablets Oral Given 10/13/16 1301)  metroNIDAZOLE (FLAGYL) tablet 2,000 mg (2,000 mg Oral Given 10/13/16 1354)     Initial Impression / Assessment and Plan / ED Course  I have reviewed the triage vital signs and the nursing notes.  Pertinent labs & imaging results that were available during my care of the patient were reviewed by me and considered in my medical decision making (see chart for details).     32 yo F here  with severe pelvic pain, acute in onset, with h/o irregular periods. Suspect ruptured cyst versus torsion. UPT negative. PID on DDx as well, will perform pelvic, TVUS, and give analgesia. No specific RLQ TTP, fevers, anorexia, and history is less c/w appendicitis. Do not suspect diverticulitis and pt has had no constipation or diarrhea.   Pelvic exam with +CMT, concerning for possible PID/cervicitis. Pelvic U/S confirms hemorrhagic cyst which is likely etiology for sx, but pt is + for trich on wet prep and given CMT, will also tx for PID. No fever, vomiting, or s/s sepsis. Will treat with rocephin, doxy, flagyl x 1 and outpt follow-up. Pt also with + clue cells but strongly denies any discharge - flagyl given for trich, will hold on further course at this time.  Final Clinical Impressions(s) / ED Diagnoses   Final diagnoses:  Trichomoniasis  PID (acute pelvic inflammatory disease)  Hemorrhagic cyst of ovary  New Prescriptions Discharge Medication List as of 10/13/2016  1:54 PM    START taking these medications   Details  doxycycline (VIBRAMYCIN) 100 MG capsule Take 1 capsule (100 mg total) by mouth 2 (two) times daily., Starting Fri 10/13/2016, Until Fri 10/27/2016, Print    fluconazole (DIFLUCAN) 150 MG tablet Take 1 tablet (150 mg total) by mouth daily. For yeast infection, after finishing antibiotics, Starting Fri 10/13/2016, Until Sat 10/14/2016, Print    HYDROcodone-acetaminophen (NORCO/VICODIN) 5-325 MG tablet Take 1-2 tablets by mouth every 6 (six) hours as needed., Starting Fri 10/13/2016, Print    naproxen (NAPROSYN) 500 MG tablet Take 1 tablet (500 mg total) by mouth 2 (two) times daily as needed for moderate pain., Starting Fri 10/13/2016, Until Fri 10/20/2016, Print         Shaune Pollack, MD 10/13/16 1950

## 2016-10-13 NOTE — ED Notes (Signed)
Unable to collect vitals due to movement

## 2016-10-13 NOTE — Discharge Instructions (Signed)
You have been seen today in the Emergency Department (ED) for pelvic pain  Your workup today shows that you had one or more sexually transmitted infections (STIs).  You have been treated with a one time dose medication for both of these conditions.  Please have your partner tested for STD's and do not resume sexual activity until you and your partner have confirmed negative test results or have both been treated.  Please follow up with your doctor as soon as possible regarding today?s ED visit and your symptoms.   Return to the ED if your pain worsens, you develop a fever, or for any other symptoms that concern you.

## 2016-10-13 NOTE — ED Triage Notes (Signed)
Pt c/o severe constant low abdominal with intermittent increases in intensity, vaginal, and rectal pain onset this morning. No vaginal discharge, blood in stool or melena.

## 2016-10-14 LAB — HIV ANTIBODY (ROUTINE TESTING W REFLEX): HIV Screen 4th Generation wRfx: NONREACTIVE

## 2016-10-16 LAB — GC/CHLAMYDIA PROBE AMP (~~LOC~~) NOT AT ARMC
CHLAMYDIA, DNA PROBE: NEGATIVE
NEISSERIA GONORRHEA: NEGATIVE

## 2019-05-14 IMAGING — US US ART/VEN ABD/PELV/SCROTUM DOPPLER LTD
1 series · 13 of 25 positions shown · non-contrast
Comparison: None

CLINICAL DATA: Acute pain.  LMP 09/27/2016.



[Series 1: us art/ven abd/pelv/scrotum doppler ltd · 0.20mm/px · 13 of 149 slices shown]
[im 1/149]
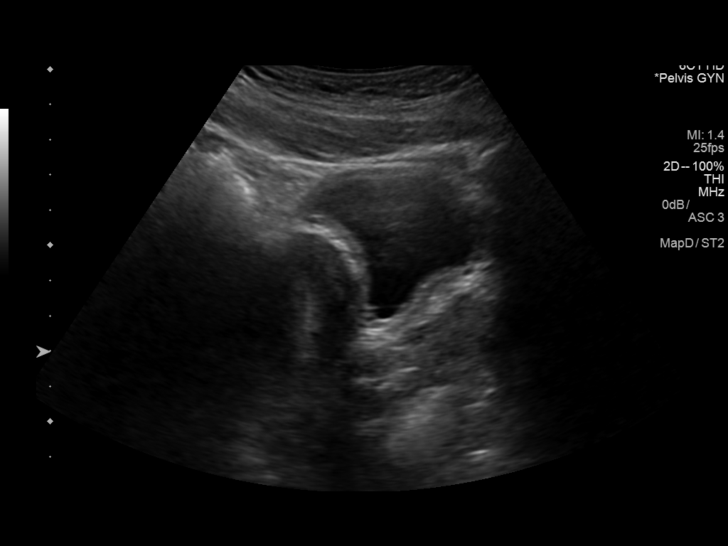
[im 13/149]
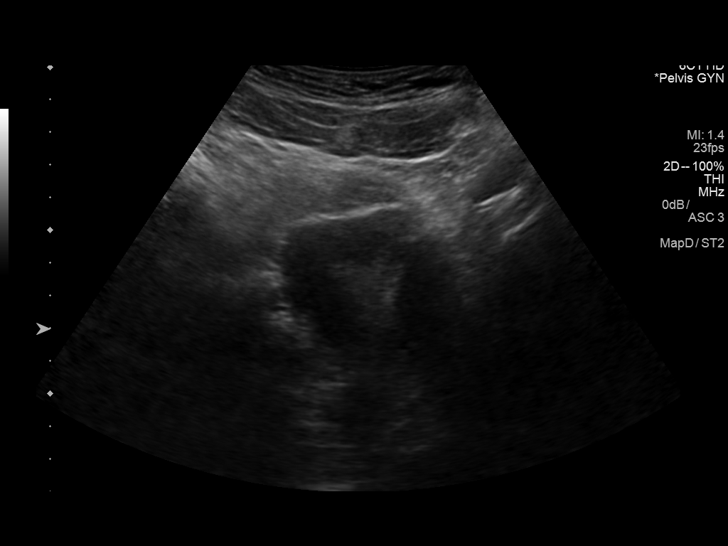
[im 25/149]
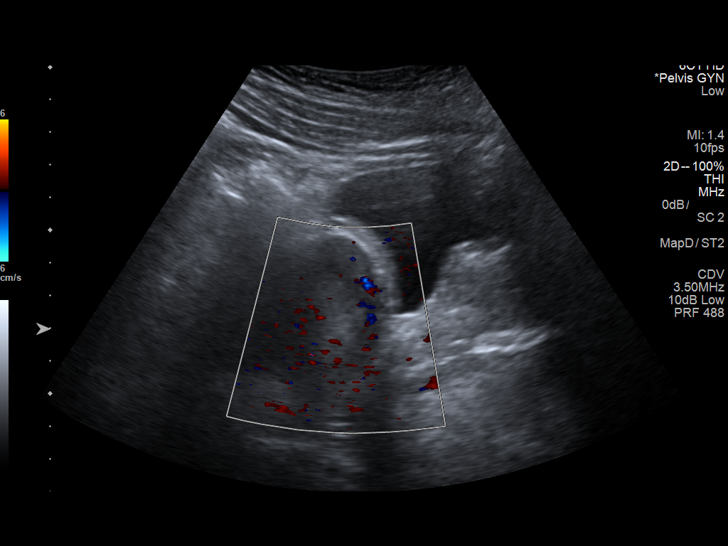
[im 38/149]
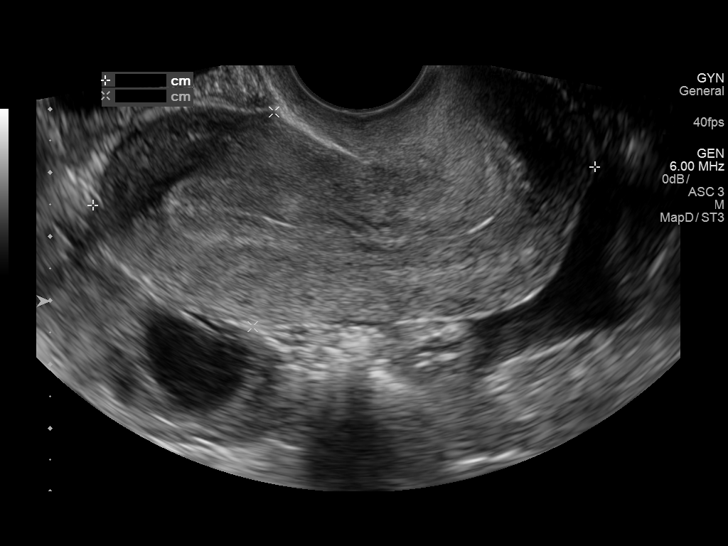
[im 50/149]
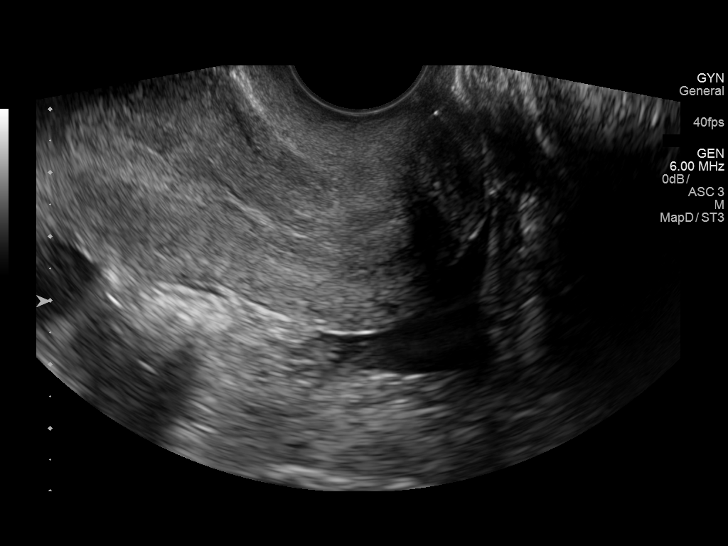
[im 62/149]
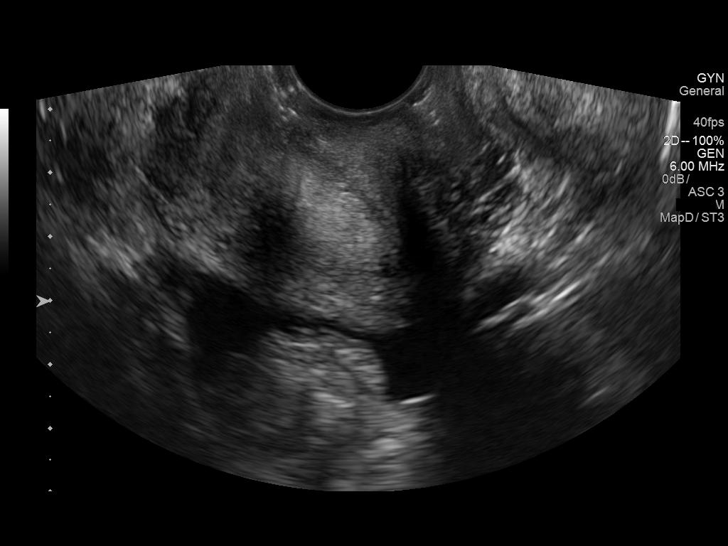
[im 75/149]
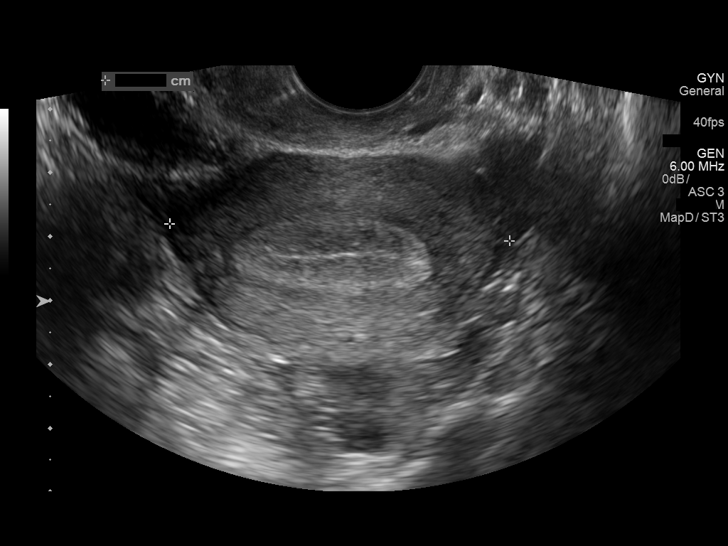
[im 87/149]
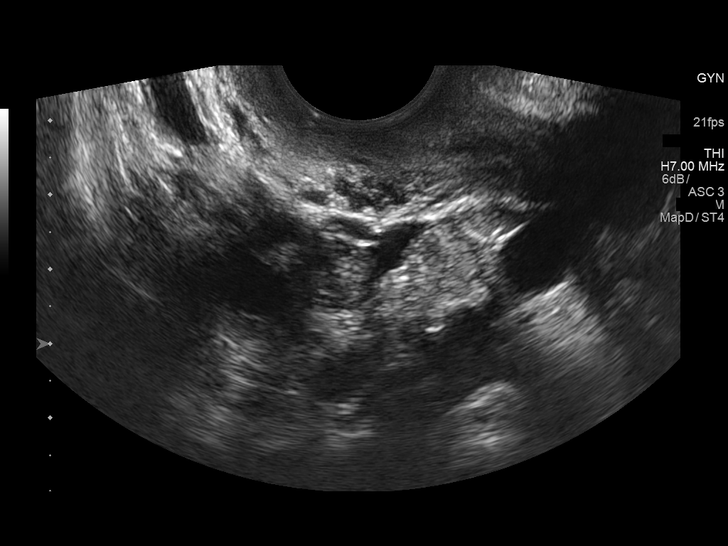
[im 99/149]
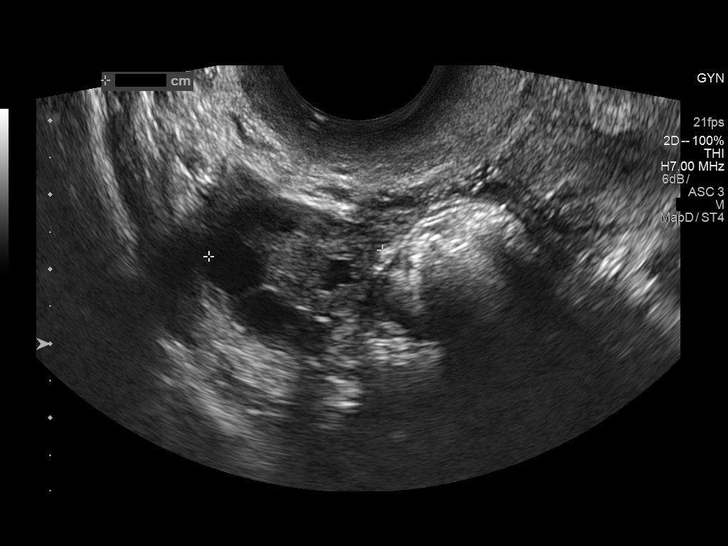
[im 112/149]
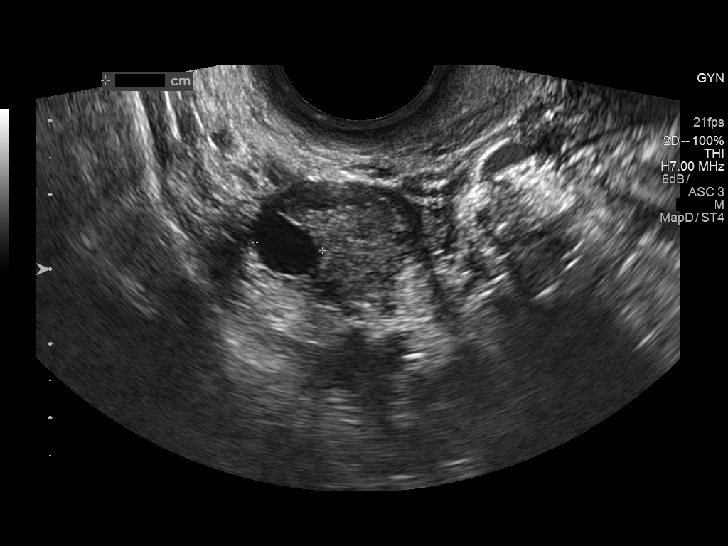
[im 124/149]
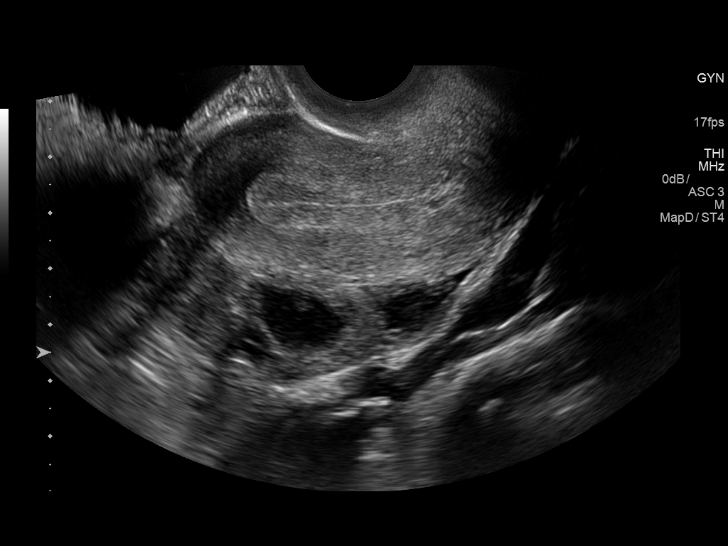
[im 136/149]
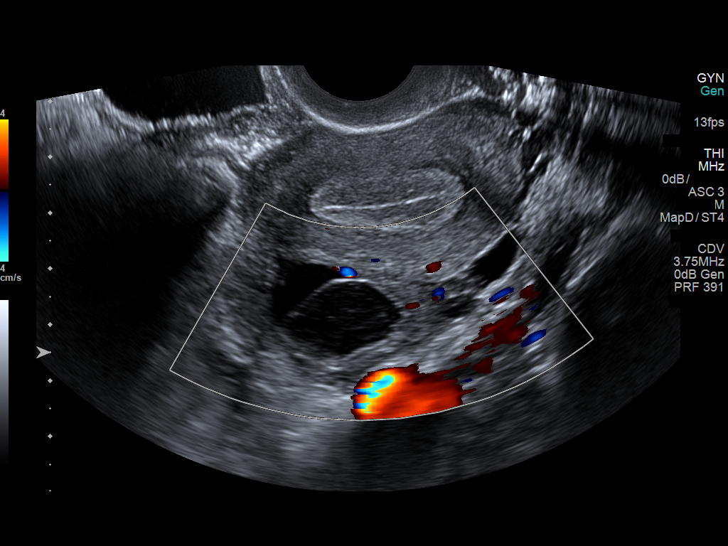
[im 149/149]
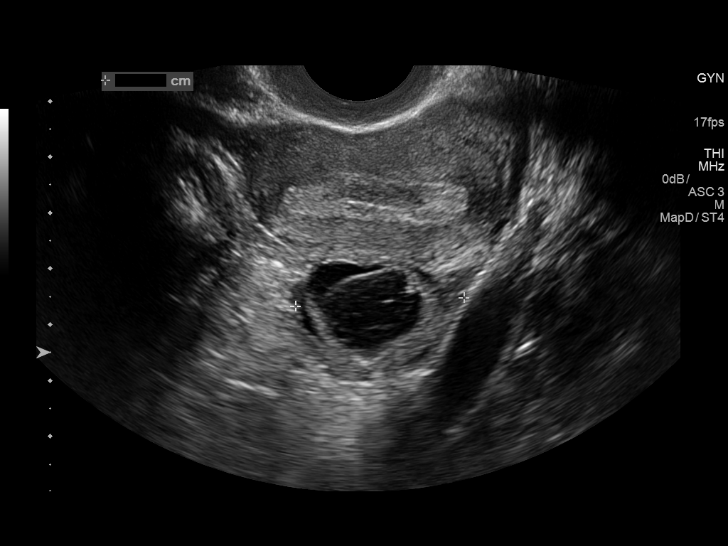

[13 of 25 positions shown; findings below may reference images not displayed]

FINDINGS: Uterus

Measurements: 7.9 x 3.4 x 5.3 cm. No fibroids or other mass
visualized.

Endometrium

Thickness: 11.4 mm. No focal abnormality visualized. Trilaminar
appearance.

Right ovary

Measurements: 2.6 x 2.1 x 2.3 cm. Normal appearance/no adnexal mass.

Left ovary

Measurements: 4.6 x 2.3 x 3.0 cm. A cyst contains internal
septations and faint internal echoes and measures 2.8 x 1.7 x
cm.

Other findings

There is a small amount of free pelvic fluid.
IMPRESSION: 1. Normal appearance of the uterus/endometrium.
2. Minimally complex cyst within the left ovary has a benign
appearance and may account for the patient's symptoms of acute pain.
Follow-up pelvic ultrasound is recommended in 8-12 weeks.
3. Normal appearance of the right ovary.

## 2019-07-07 ENCOUNTER — Emergency Department (HOSPITAL_BASED_OUTPATIENT_CLINIC_OR_DEPARTMENT_OTHER)
Admission: EM | Admit: 2019-07-07 | Discharge: 2019-07-07 | Disposition: A | Discharge status: Home / Self Care | Payer: Self-pay | Attending: Emergency Medicine | Admitting: Emergency Medicine

## 2019-07-07 ENCOUNTER — Other Ambulatory Visit: Payer: Self-pay

## 2019-07-07 ENCOUNTER — Encounter (HOSPITAL_BASED_OUTPATIENT_CLINIC_OR_DEPARTMENT_OTHER): Payer: Self-pay

## 2019-07-07 DIAGNOSIS — I10 Essential (primary) hypertension: Secondary | ICD-10-CM | POA: Insufficient documentation

## 2019-07-07 DIAGNOSIS — Z79899 Other long term (current) drug therapy: Secondary | ICD-10-CM | POA: Insufficient documentation

## 2019-07-07 DIAGNOSIS — R519 Headache, unspecified: Secondary | ICD-10-CM | POA: Insufficient documentation

## 2019-07-07 DIAGNOSIS — F17210 Nicotine dependence, cigarettes, uncomplicated: Secondary | ICD-10-CM | POA: Insufficient documentation

## 2019-07-07 LAB — PREGNANCY, URINE: Preg Test, Ur: NEGATIVE

## 2019-07-07 MED ORDER — DIPHENHYDRAMINE HCL 50 MG/ML IJ SOLN
12.5000 mg | Freq: Once | INTRAMUSCULAR | Status: AC
  Administered 2019-07-07: 12.5 mg via INTRAVENOUS
  Filled 2019-07-07: qty 1

## 2019-07-07 MED ORDER — METOCLOPRAMIDE HCL 5 MG/ML IJ SOLN
10.0000 mg | Freq: Once | INTRAMUSCULAR | Status: AC
  Administered 2019-07-07: 19:00:00 10 mg via INTRAVENOUS
  Filled 2019-07-07: qty 2

## 2019-07-07 MED ORDER — KETOROLAC TROMETHAMINE 30 MG/ML IJ SOLN
30.0000 mg | Freq: Once | INTRAMUSCULAR | Status: AC
  Administered 2019-07-07: 30 mg via INTRAVENOUS
  Filled 2019-07-07: qty 1

## 2019-07-07 MED ORDER — SODIUM CHLORIDE 0.9 % IV BOLUS
1000.0000 mL | Freq: Once | INTRAVENOUS | Status: AC
  Administered 2019-07-07: 1000 mL via INTRAVENOUS

## 2019-07-07 NOTE — ED Triage Notes (Signed)
HA started on 4/3 with photophobia and nausea. Reports pain has been waxing and waning in severity.

## 2019-07-07 NOTE — ED Provider Notes (Signed)
[facility] EMERGENCY DEPARTMENT Provider Note   CSN: [id number] Arrival date & time: 07/07/19  1743    History Chief Complaint  Patient presents with  . Headache    Debbie Baxter is a 34 y.o. female with headache x 3 days. Hx migraines. Associated with menstrual cycle previously. Currently on cycle. Admits to NBNB emesis and photophobia. No recent trauma, head injury. Feels similar to prior migraine. Has taken Ibuprofen with minimal relief. Pain rated a 7/10.  No lightheadedness, dizziness, neck pain, neck stiffness, paresthesias, unilateral weakness, facial droop, chest pain, shortness breath abdominal pain, diarrhea, dysuria.  Denies aggravating or alleviating factors.  History obtained from patient and past medical record.  No interpreter is used.   HPI     Past Medical History:  Diagnosis Date  . Hypertension     There are no problems to display for this patient.   History reviewed. No pertinent surgical history.   OB History   No obstetric history on file.     No family history on file.  Social History   Tobacco Use  . Smoking status: Current Every Day Smoker  . Smokeless tobacco: Never Used  Substance Use Topics  . Alcohol use: Yes    Comment: occassionally  . Drug use: No    Home Medications Prior to Admission medications   Medication Sig Start Date End Date Taking? Authorizing Provider  Biotin 1000 MCG CHEW Chew 2 each by mouth daily.    [provider]  HYDROcodone-acetaminophen (NORCO/VICODIN) 5-325 MG tablet Take 1-2 tablets by mouth every 6 (six) hours as needed. 10/13/16   Shaune Pollack, MD    Allergies    Patient has no known allergies.  Review of Systems   Review of Systems  Constitutional: Negative.   HENT: Negative.   Eyes: Positive for photophobia.  Respiratory: Negative.   Cardiovascular: Negative.   Gastrointestinal: Positive for nausea and vomiting. Negative for abdominal distention, abdominal pain, anal  bleeding, blood in stool, constipation, diarrhea and rectal pain.  Genitourinary: Negative.   Musculoskeletal: Negative.   Skin: Negative.   Neurological: Positive for headaches. Negative for dizziness, tremors, seizures, syncope, facial asymmetry, speech difficulty, weakness, light-headedness and numbness.  All other systems reviewed and are negative.  Physical Exam Updated Vital Signs BP (!) 140/101   Pulse 84   Temp 98.9 F (37.2 C) (Oral)   Resp 16   Ht 5\' 3"  (1.6 m)   Wt 68 kg   SpO2 97%   BMI 26.57 kg/m   Physical Exam Physical Exam  Constitutional: Pt is oriented to person, place, and time. Pt appears well-developed and well-nourished. No distress.  HENT:  Head: Normocephalic and atraumatic.  Mouth/Throat: Oropharynx is clear and moist.  Eyes: Conjunctivae and EOM are normal. Pupils are equal, round, and reactive to light. No scleral icterus.  No horizontal, vertical or rotational nystagmus  Neck: Normal range of motion. Neck supple.  Full active and passive ROM without pain No midline or paraspinal tenderness No nuchal rigidity or meningeal signs  Cardiovascular: Normal rate, regular rhythm and intact distal pulses.   Pulmonary/Chest: Effort normal and breath sounds normal. No respiratory distress. Pt has no wheezes. No rales.  Abdominal: Soft. Bowel sounds are normal. There is no tenderness. There is no rebound and no guarding.  Musculoskeletal: Normal range of motion.  Lymphadenopathy:    No cervical adenopathy.  Neurological: Pt. is alert and oriented to person, place, and time. He has normal reflexes. No cranial nerve deficit.  Exhibits normal muscle tone. Coordination normal.  Mental Status:  Alert, oriented, thought content appropriate. Speech fluent without evidence of aphasia. Able to follow 2 step commands without difficulty.  Cranial Nerves:  II:  Peripheral visual fields grossly normal, pupils equal, round, reactive to light III,IV, VI: ptosis not  present, extra-ocular motions intact bilaterally  V,VII: smile symmetric, facial light touch sensation equal VIII: hearing grossly normal bilaterally  IX,X: midline uvula rise  XI: bilateral shoulder shrug equal and strong XII: midline tongue extension  Motor:  5/5 in upper and lower extremities bilaterally including strong and equal grip strength and dorsiflexion/plantar flexion Sensory: Pinprick and light touch normal in all extremities.  Deep Tendon Reflexes: 2+ and symmetric  Cerebellar: normal finger-to-nose with bilateral upper extremities Gait: normal gait and balance CV: distal pulses palpable throughout   Skin: Skin is warm and dry. No rash noted. Pt is not diaphoretic.  Psychiatric: Pt has a normal mood and affect. Behavior is normal. Judgment and thought content normal.  Nursing note and vitals reviewed. ED Results / Procedures / Treatments   Labs (all labs ordered are listed, but only abnormal results are displayed) Labs Reviewed  PREGNANCY, URINE    EKG None  Radiology No results found.  Procedures Procedures (including critical care time)  Medications Ordered in ED Medications  metoCLOPramide (REGLAN) injection 10 mg (10 mg Intravenous Given 07/07/19 1850)  diphenhydrAMINE (BENADRYL) injection 12.5 mg (12.5 mg Intravenous Given 07/07/19 1852)  sodium chloride 0.9 % bolus 1,000 mL (1,000 mLs Intravenous New Bag/Given 07/07/19 1847)  ketorolac (TORADOL) 30 MG/ML injection 30 mg (30 mg Intravenous Given 07/07/19 1907)   ED Course  I have reviewed the triage vital signs and the nursing notes.  Pertinent labs & imaging results that were available during my care of the patient were reviewed by me and considered in my medical decision making (see chart for details).  34 year old presents for evaluation of headache. Feels similar to prior migraines. Non focal neuro exam without deficits. Plan for migraine cocktail and reassess.  Patient HA resolved with migraine cocktail.  Continued non focal neuro exam without deficits.  Pt HA treated and improved while in ED.  Presentation is like pts typical HA and non concerning for [facility], ICH, Meningitis, dissection or temporal arteritis. Pt is afebrile with no focal neuro deficits, nuchal rigidity, or change in vision. Pt is to follow up with PCP to discuss prophylactic medication. Pt verbalizes understanding and is agreeable with plan to dc.   The patient has been appropriately medically screened and/or stabilized in the ED. I have low suspicion for any other emergent medical condition which would require further screening, evaluation or treatment in the ED or require inpatient management.  Patient is hemodynamically stable and in no acute distress.  Patient able to ambulate in department prior to ED.  Evaluation does not show acute pathology that would require ongoing or additional emergent interventions while in the emergency department or further inpatient treatment.  I have discussed the diagnosis with the patient and answered all questions.  Pain is been managed while in the emergency department and patient has no further complaints prior to discharge.  Patient is comfortable with plan discussed in room and is stable for discharge at this time.  I have discussed strict return precautions for returning to the emergency department.  Patient was encouraged to follow-up with PCP/specialist refer to at discharge.   MDM Rules/Calculators/A&P  Final Clinical Impression(s) / ED Diagnoses Final diagnoses:  Acute nonintractable headache, unspecified headache type    Rx / DC Orders ED Discharge Orders    None       Jeremia Groot A, PA-C 07/07/19 1943    Lennice Sites, DO 07/07/19 2153

## 2019-07-07 NOTE — Discharge Instructions (Signed)
Return for new or worsening symptoms

## 2020-04-30 ENCOUNTER — Encounter (HOSPITAL_BASED_OUTPATIENT_CLINIC_OR_DEPARTMENT_OTHER): Payer: Self-pay | Admitting: *Deleted

## 2020-04-30 ENCOUNTER — Emergency Department (HOSPITAL_BASED_OUTPATIENT_CLINIC_OR_DEPARTMENT_OTHER)
Admission: EM | Admit: 2020-04-30 | Discharge: 2020-04-30 | Disposition: A | Discharge status: Home / Self Care | Payer: Self-pay | Attending: Emergency Medicine | Admitting: Emergency Medicine

## 2020-04-30 ENCOUNTER — Other Ambulatory Visit: Payer: Self-pay

## 2020-04-30 DIAGNOSIS — K047 Periapical abscess without sinus: Secondary | ICD-10-CM

## 2020-04-30 DIAGNOSIS — K0889 Other specified disorders of teeth and supporting structures: Secondary | ICD-10-CM | POA: Insufficient documentation

## 2020-04-30 DIAGNOSIS — I10 Essential (primary) hypertension: Secondary | ICD-10-CM | POA: Insufficient documentation

## 2020-04-30 DIAGNOSIS — F17200 Nicotine dependence, unspecified, uncomplicated: Secondary | ICD-10-CM | POA: Insufficient documentation

## 2020-04-30 MED ORDER — CLINDAMYCIN HCL 300 MG PO CAPS: 300.0000 mg | ORAL_CAPSULE | Freq: Four times a day (QID) | ORAL | 0 refills | Status: AC

## 2020-04-30 MED ORDER — CLINDAMYCIN HCL 150 MG PO CAPS
300.0000 mg | ORAL_CAPSULE | Freq: Once | ORAL | Status: AC
  Administered 2020-04-30: 300 mg via ORAL
  Filled 2020-04-30: qty 2

## 2020-04-30 NOTE — ED Triage Notes (Signed)
C/o dental pain and facial swelling x 2 days

## 2020-04-30 NOTE — Discharge Instructions (Addendum)
You were evaluated in the emergency department today for your dental pain.You were administered your first dose of antibiotics in the emergency department.  Additionally you have been prescribed 7 days of antibiotics outpatient.  Please take entire course of antibiotics as directed.  Continue using ibuprofen / Tylenol, as well as Orajel for pain.  You will need to follow-up with your dentist for continued management of this. Please see dental resources below. Return to the emergency department for fevers, swelling or pain under the tongue or in the neck, difficulty breathing or swallowing, nausea or vomiting that does not stop, or any other new or concerning symptoms.

## 2020-04-30 NOTE — ED Notes (Signed)
ED Provider at bedside. 

## 2020-04-30 NOTE — ED Provider Notes (Signed)
[facility] EMERGENCY DEPARTMENT Provider Note   CSN: [id number] Arrival date & time: 04/30/20  1523     History Chief Complaint  Patient presents with  . Dental Pain  History provided by the patient herself.  Debbie Baxter is a 35 y.o. female with history of dental caries presents with 24 hours of right maxillary tooth pain and cheek swelling.  Pain significant improved with administration of ibuprofen and Tylenol at home.  Patient has not had dental infection in the past, but endorses noncompliance with dental appointments due to fear of dentist.  Denies any fevers or chills denies any chest pain, shortness of breath, difficulty swallowing, sore throat, or change in her voice. Denies any lingual swelling or lip swelling, denies any swelling in her neck.  I personally reviewed this patient's medical records.  She has history of hypertension, pelvic inflammatory disease, and dental pain.  HPI     Past Medical History:  Diagnosis Date  . Hypertension     There are no problems to display for this patient.   History reviewed. No pertinent surgical history.   OB History   No obstetric history on file.     No family history on file.  Social History   Tobacco Use  . Smoking status: Current Every Day Smoker  . Smokeless tobacco: Never Used  Vaping Use  . Vaping Use: Former  Substance Use Topics  . Alcohol use: Yes    Comment: occassionally  . Drug use: No    Home Medications Prior to Admission medications   Medication Sig Start Date End Date Taking? Authorizing Provider  Biotin 1000 MCG CHEW Chew 2 each by mouth daily.    [provider]  HYDROcodone-acetaminophen (NORCO/VICODIN) 5-325 MG tablet Take 1-2 tablets by mouth every 6 (six) hours as needed. 10/13/16   Shaune Pollack, MD    Allergies    Patient has no known allergies.  Review of Systems   Review of Systems  Constitutional: Negative.   HENT: Positive for dental problem.         Right cheek swelling  Eyes: Negative.   Respiratory: Negative.   Cardiovascular: Negative.   Gastrointestinal: Negative.   Genitourinary: Negative.   Musculoskeletal: Negative.   Skin: Negative.   Neurological: Negative.   Hematological: Negative.   Psychiatric/Behavioral: Negative.     Physical Exam Updated Vital Signs BP (!) 137/93 (BP Location: Right Arm)   Pulse 73   Temp 99.7 F (37.6 C) (Oral)   Resp 18   Ht 5\' 3"  (1.6 m)   Wt 72.6 kg   LMP 03/31/2020   SpO2 99%   BMI 28.34 kg/m   Physical Exam Vitals and nursing note reviewed.  HENT:     Head: Normocephalic and atraumatic.      Nose: Nose normal.     Mouth/Throat:     Mouth: Mucous membranes are moist.     Dentition: Abnormal dentition. Dental caries present.     Palate: No mass.     Pharynx: Oropharynx is clear. Uvula midline. No oropharyngeal exudate, posterior oropharyngeal erythema or uvula swelling.     Tonsils: No tonsillar exudate or tonsillar abscesses.      Comments: No sublingual or submental tenderness to palpation, no lingual edema or edema of the lips.  No crepitus in the neck, or difficulty swallowing. Eyes:     General: Lids are normal. Vision grossly intact.        Right eye: No discharge.  Left eye: No discharge.     Extraocular Movements: Extraocular movements intact.     Conjunctiva/sclera: Conjunctivae normal.     Pupils: Pupils are equal, round, and reactive to light.  Neck:     Trachea: Trachea and phonation normal.  Cardiovascular:     Rate and Rhythm: Normal rate and regular rhythm.     Pulses: Normal pulses.     Heart sounds: Normal heart sounds. No murmur heard.   Pulmonary:     Effort: Pulmonary effort is normal. No respiratory distress.     Breath sounds: Normal breath sounds. No wheezing or rales.  Chest:     Chest wall: No swelling, tenderness, crepitus or edema.  Abdominal:     General: Bowel sounds are normal. There is no distension.     Palpations: Abdomen is  soft.     Tenderness: There is no abdominal tenderness.  Musculoskeletal:        General: No deformity.     Cervical back: Full passive range of motion without pain, normal range of motion and neck supple. No rigidity, tenderness or crepitus. No pain with movement or spinous process tenderness.     Right lower leg: No edema.     Left lower leg: No edema.  Lymphadenopathy:     Cervical: No cervical adenopathy.  Skin:    General: Skin is warm and dry.     Capillary Refill: Capillary refill takes less than 2 seconds.  Neurological:     General: No focal deficit present.     Mental Status: She is alert and oriented to person, place, and time. Mental status is at baseline.  Psychiatric:        Mood and Affect: Mood normal.     ED Results / Procedures / Treatments   Labs (all labs ordered are listed, but only abnormal results are displayed) Labs Reviewed - No data to display  EKG None  Radiology No results found.  Procedures Procedures   Medications Ordered in ED Medications  clindamycin (CLEOCIN) capsule 300 mg (has no administration in time range)    ED Course  I have reviewed the triage vital signs and the nursing notes.  Pertinent labs & imaging results that were available during my care of the patient were reviewed by me and considered in my medical decision making (see chart for details).    MDM Rules/Calculators/A&P                          35 year old female with history of dental caries and noncompliance with dental appointments who presents with concern for right cheek swelling and right maxillary tooth pain x24 hours.  Differential diagnosis for the patient symptoms include not limited to dental fracture, dental abscess periapical infection, Ludwick's angina, PTA, retropharyngeal abscess.  Patient's vital signs are normal on intake.  Cardiopulmonary exam is without abnormality, abdominal exam is benign.  Patient with fractured decaying tooth in the right  maxilla, with surrounding edema and erythema, tenderness to palpation.  Consistent with dental infection.  No signs of Ludwig's angina or oropharyngeal abscess.  Will administer first dose of antibiotic here in the emergency department.  Given reassuring physical exam and vital signs, no further work-up is warranted in the ED at this time.  No indication for imaging given reassuring physical exam.  Will discharge with course of outpatient antibiotics and recommendation for follow-up with dentist.  Charlotte Sanes voiced understanding of her medical evaluation and treatment plan.  Each of her questions was answered to her expressed satisfaction.  Return precautions given.  Patient is well-appearing, stable, and appropriate for discharge at this time.  This chart was dictated using voice recognition software, Dragon. Despite the best efforts of this provider to proofread and correct errors, errors may still occur which can change documentation meaning.  Final Clinical Impression(s) / ED Diagnoses Final diagnoses:  None    Rx / DC Orders ED Discharge Orders    None       Sherrilee Gilles 04/30/20 1802    Cheryll Cockayne, MD 05/01/20 1547

## 2021-05-24 ENCOUNTER — Ambulatory Visit (HOSPITAL_COMMUNITY)
Admission: EM | Admit: 2021-05-24 | Discharge: 2021-05-24 | Disposition: A | Discharge status: Home / Self Care | Payer: Self-pay | Attending: Nurse Practitioner | Admitting: Nurse Practitioner

## 2021-05-24 ENCOUNTER — Other Ambulatory Visit: Payer: Self-pay

## 2021-05-24 ENCOUNTER — Encounter (HOSPITAL_COMMUNITY): Payer: Self-pay | Admitting: Nurse Practitioner

## 2021-05-24 ENCOUNTER — Encounter (HOSPITAL_COMMUNITY): Payer: Self-pay | Admitting: Emergency Medicine

## 2021-05-24 ENCOUNTER — Emergency Department (HOSPITAL_COMMUNITY): Payer: Self-pay

## 2021-05-24 ENCOUNTER — Emergency Department (HOSPITAL_COMMUNITY)
Admission: EM | Admit: 2021-05-24 | Discharge: 2021-05-24 | Disposition: A | Discharge status: Home / Self Care | Payer: Self-pay | Attending: Emergency Medicine | Admitting: Emergency Medicine

## 2021-05-24 DIAGNOSIS — B029 Zoster without complications: Secondary | ICD-10-CM

## 2021-05-24 DIAGNOSIS — Y9301 Activity, walking, marching and hiking: Secondary | ICD-10-CM | POA: Insufficient documentation

## 2021-05-24 DIAGNOSIS — W108XXA Fall (on) (from) other stairs and steps, initial encounter: Secondary | ICD-10-CM | POA: Insufficient documentation

## 2021-05-24 DIAGNOSIS — S93402A Sprain of unspecified ligament of left ankle, initial encounter: Secondary | ICD-10-CM | POA: Insufficient documentation

## 2021-05-24 MED ORDER — VALACYCLOVIR HCL 1 G PO TABS: 1000.0000 mg | ORAL_TABLET | Freq: Three times a day (TID) | ORAL | 0 refills | Status: AC

## 2021-05-24 MED ORDER — OXYCODONE-ACETAMINOPHEN 5-325 MG PO TABS
1.0000 | ORAL_TABLET | ORAL | Status: DC | PRN
  Administered 2021-05-24: 1 via ORAL
  Filled 2021-05-24: qty 1

## 2021-05-24 NOTE — Discharge Instructions (Addendum)
Please keep the rash covered with a bandage and wash your hands with soap and water frequently.  Please take the full course of the Valtrex.  You can take acetaminophen 500 mg every 4 hours as needed for pain.  If your pain becomes severe, if fever develops, or if the rash starts oozing pus, please seek emergent care.

## 2021-05-24 NOTE — ED Triage Notes (Signed)
Per pt, states she fell sown 3 steps injuring her left ankle

## 2021-05-24 NOTE — ED Provider Notes (Signed)
[facility] DEPT Provider Note   CSN: [id number] Arrival date & time: 05/24/21  1148     History  Chief Complaint  Patient presents with   Ankle Pain    Debbie Baxter is a 36 y.o. female.  36 year old female presents with complaint of left ankle pain.  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  No other injuries as result.  Unable to bear weight on her ankle secondary to pain since the fall.  No prior injuries to this ankle.      Home Medications Prior to Admission medications   Medication Sig Start Date End Date Taking? Authorizing Provider  Biotin 1000 MCG CHEW Chew 2 each by mouth daily.    [provider]  HYDROcodone-acetaminophen (NORCO/VICODIN) 5-325 MG tablet Take 1-2 tablets by mouth every 6 (six) hours as needed. 10/13/16   Duffy Bruce, MD  valACYclovir (VALTREX) 1000 MG tablet Take 1 tablet (1,000 mg total) by mouth 3 (three) times daily for 7 days. 05/24/21 05/31/21  Eulogio Bear, NP      Allergies    Patient has no known allergies.    Review of Systems   Review of Systems Negative except as per HPI Physical Exam Updated Vital Signs BP (!) 134/99 (BP Location: Right Arm)    Pulse 83    Temp 99.5 F (37.5 C) (Oral)    Resp 18    LMP 05/13/2021    SpO2 99%  Physical Exam Vitals and nursing note reviewed.  Constitutional:      General: She is not in acute distress.    Appearance: She is well-developed. She is not diaphoretic.  HENT:     Head: Normocephalic and atraumatic.  Cardiovascular:     Pulses: Normal pulses.  Pulmonary:     Effort: Pulmonary effort is normal.  Musculoskeletal:        General: Swelling and tenderness present. No deformity.     Comments: Tenderness anterior to the lateral malleolus on the left ankle.  Sensation intact, DP pulse present.  Skin intact.  No pain at the knee to the calcaneus or along the fifth  metatarsal.    Skin:    General: Skin is warm and dry.     Findings: No bruising, erythema or rash.  Neurological:     Mental Status: She is alert and oriented to person, place, and time.     Sensory: No sensory deficit.     Motor: No weakness.  Psychiatric:        Behavior: Behavior normal.    ED Results / Procedures / Treatments   Labs (all labs ordered are listed, but only abnormal results are displayed) Labs Reviewed - No data to display  EKG None  Radiology DG Ankle Complete Left  Result Date: 05/24/2021 CLINICAL DATA:  Post fall, now with lateral sided left sided ankle pain. EXAM: LEFT ANKLE COMPLETE - 3+ VIEW COMPARISON:  None. FINDINGS: No fracture or dislocation. Joint spaces are preserved. The ankle mortise is preserved. No ankle joint effusion. Regional soft tissues appear normal. No radiopaque foreign body. No plantar calcaneal spur. IMPRESSION: No fracture. Electronically Signed   By: Sandi Mariscal M.D.   On: 05/24/2021 13:24    Procedures Procedures    Medications Ordered in ED Medications  oxyCODONE-acetaminophen (PERCOCET/ROXICET) 5-325 MG per tablet 1 tablet (1 tablet Oral Given 05/24/21 1448)    ED Course/ Medical  Decision Making/ A&P                           Medical Decision Making Amount and/or Complexity of Data Reviewed Radiology: ordered and independent interpretation performed.  Risk OTC drugs. Prescription drug management.  36 year old female with complaint of left ankle injury as above.  Found to tenderness anterior to the lateral malleolus, no pain with metatarsal or proximal fibula.  DP pulse present, sensation intact, skin intact.  X-ray is negative for fracture.  Patient placed in ankle ASO, given crutches to weight-bear as tolerated.  Follow-up with orthopedics in 1 week if not improving.         Final Clinical Impression(s) / ED Diagnoses Final diagnoses:  Sprain of left ankle, unspecified ligament, initial encounter    Rx / DC  Orders ED Discharge Orders     None         Tacy Learn, PA-C 05/24/21 1505    Dorie Rank, MD 05/25/21 [phone]

## 2021-05-24 NOTE — ED Triage Notes (Signed)
Pt presents to the office for blister on buttocks x 1 day. These blister are recurring for patient.

## 2021-05-24 NOTE — ED Provider Notes (Signed)
[facility]    CSN: [id number] Arrival date & time: 05/24/21  3710      History   Chief Complaint No chief complaint on file.   HPI Debbie Baxter is a 36 y.o. female.   Patient reports she started having pain in her left buttocks yesterday.  She woke up this morning with a rash in that area.  She describes the rash as a bunch of tiny blisters that are itchy and burn.  She denies fevers, nausea/vomiting, oozing, and swelling.  There is pain from the rash that wraps around to her groin.  She had a similar rash 4 months ago that resolved on its own. 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 She has tried applying hydrogen peroxide to the wound without relief of symptoms.     Past Medical History:  Diagnosis Date   Hypertension     There are no problems to display for this patient.   History reviewed. No pertinent surgical history.  OB History   No obstetric history on file.      Home Medications    Prior to Admission medications   Medication Sig Start Date End Date Taking? Authorizing Provider  valACYclovir (VALTREX) 1000 MG tablet Take 1 tablet (1,000 mg total) by mouth 3 (three) times daily for 7 days. 05/24/21 05/31/21 Yes Valentino Nose, NP  Biotin 1000 MCG CHEW Chew 2 each by mouth daily.    [provider]  HYDROcodone-acetaminophen (NORCO/VICODIN) 5-325 MG tablet Take 1-2 tablets by mouth every 6 (six) hours as needed. 10/13/16   Shaune Pollack, MD    Family History History reviewed. No pertinent family history.  Social History Social History   Tobacco Use   Smoking status: Every Day   Smokeless tobacco: Never  Vaping Use   Vaping Use: Former  Substance Use Topics   Alcohol use: Yes    Comment: occassionally   Drug use: No     Allergies   Patient has no known allergies.   Review of Systems Review of Systems Per HPI  Physical Exam Triage Vital Signs ED Triage Vitals  [05/24/21 0832]  Enc Vitals Group     BP (!) 148/108     Pulse Rate 78     Resp 16     Temp 97.9 F (36.6 C)     Temp Source Oral     SpO2 100 %     Weight      Height      Head Circumference      Peak Flow      Pain Score 5     Pain Loc      Pain Edu?      Excl. in GC?    No data found.  Updated Vital Signs BP (!) 148/108 (BP Location: Left Arm)    Pulse 78    Temp 97.9 F (36.6 C) (Oral)    Resp 16    LMP 05/13/2021    SpO2 100%   Visual Acuity Right Eye Distance:   Left Eye Distance:   Bilateral Distance:    Right Eye Near:   Left Eye Near:    Bilateral Near:     Physical Exam Vitals and nursing note reviewed.  Constitutional:      General: She is not in acute distress.    Appearance: Normal appearance. She is not toxic-appearing.  HENT:     Head: Normocephalic and  atraumatic.  Musculoskeletal:     Cervical back: Normal range of motion.  Skin:    General: Skin is warm and dry.     Capillary Refill: Capillary refill takes less than 2 seconds.     Findings: Rash present. Rash is vesicular.          Comments: Vesicular rash to left buttocks; no open blisters or oozing appreciated. No other skin abnormalities appreciated on exam.  Neurological:     Mental Status: She is alert and oriented to person, place, and time.     Motor: No weakness.     Gait: Gait normal.  Psychiatric:        Mood and Affect: Mood normal.        Behavior: Behavior normal.        Thought Content: Thought content normal.        Judgment: Judgment normal.     UC Treatments / Results  Labs (all labs ordered are listed, but only abnormal results are displayed) Labs Reviewed - No data to display  EKG   Radiology No results found.  Procedures Procedures (including critical care time)  Medications Ordered in UC Medications - No data to display  Initial Impression / Assessment and Plan / UC Course  I have reviewed the triage vital signs and the nursing notes.  Pertinent  labs & imaging results that were available during my care of the patient were reviewed by me and considered in my medical decision making (see chart for details).     Examination consistent with herpes zoster.  Start Valtrex 1000 mg three times daily for 7 days.  Use acetaminophen for pain.  Encouraged keeping the rash covered, good hand hygiene, and avoidance of immunocompromised and pregnant individuals.  If signs or symptoms of infection develop, seek emergent care.    Final Clinical Impressions(s) / UC Diagnoses   Final diagnoses:  Herpes zoster without complication     Discharge Instructions      Please keep the rash covered with a bandage and wash your hands with soap and water frequently.  Please take the full course of the Valtrex.  You can take acetaminophen 500 mg every 4 hours as needed for pain.  If your pain becomes severe, if fever develops, or if the rash starts oozing pus, please seek emergent care.      ED Prescriptions     Medication Sig Dispense Auth. Provider   valACYclovir (VALTREX) 1000 MG tablet Take 1 tablet (1,000 mg total) by mouth 3 (three) times daily for 7 days. 21 tablet Valentino Nose, NP      PDMP not reviewed this encounter.   Valentino Nose, NP 05/24/21 [phone]

## 2021-05-24 NOTE — Discharge Instructions (Signed)
Ice and elevate for 20 minutes at a time. Motrin and Tylenol for pain as needed as directed. Use crutches, weight bear as tolerated. Follow up with orthopedics in 1 week if not improving.

## 2022-11-16 IMAGING — CR DG ANKLE COMPLETE 3+V*L*
3 series · 3 of 3 positions shown · non-contrast
Comparison: None.

CLINICAL DATA: Post fall, now with lateral sided left sided ankle
pain.

EXAM:
LEFT ANKLE COMPLETE - 3+ VIEW

[x ankle ap left]
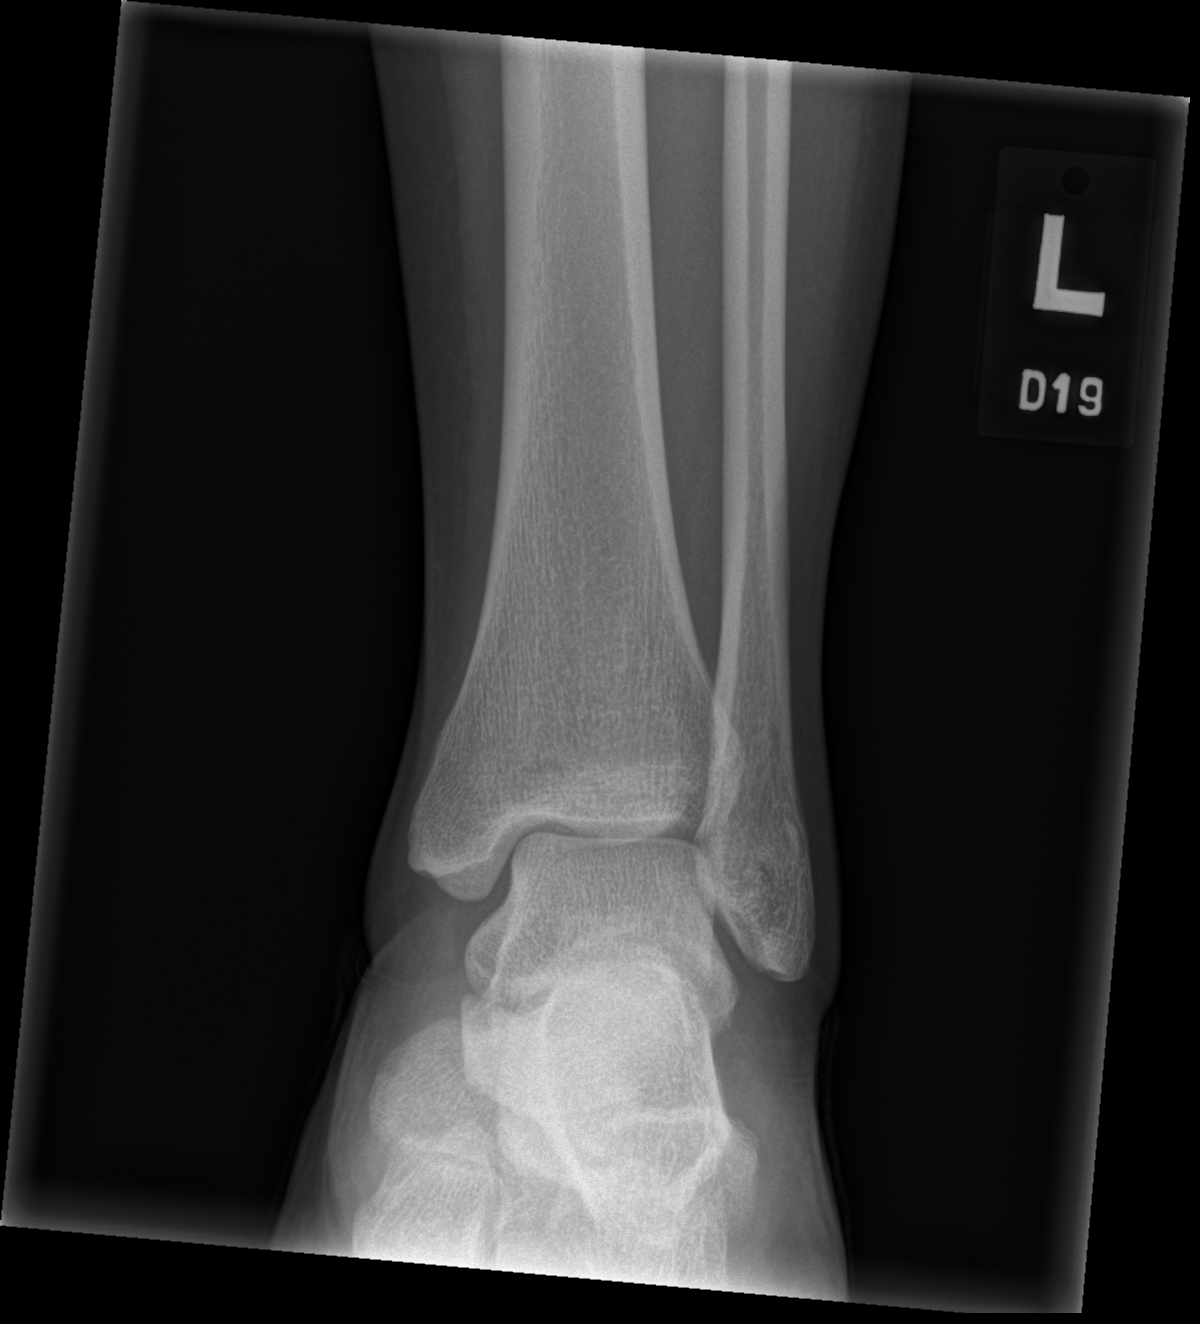

[x ankle obl left]
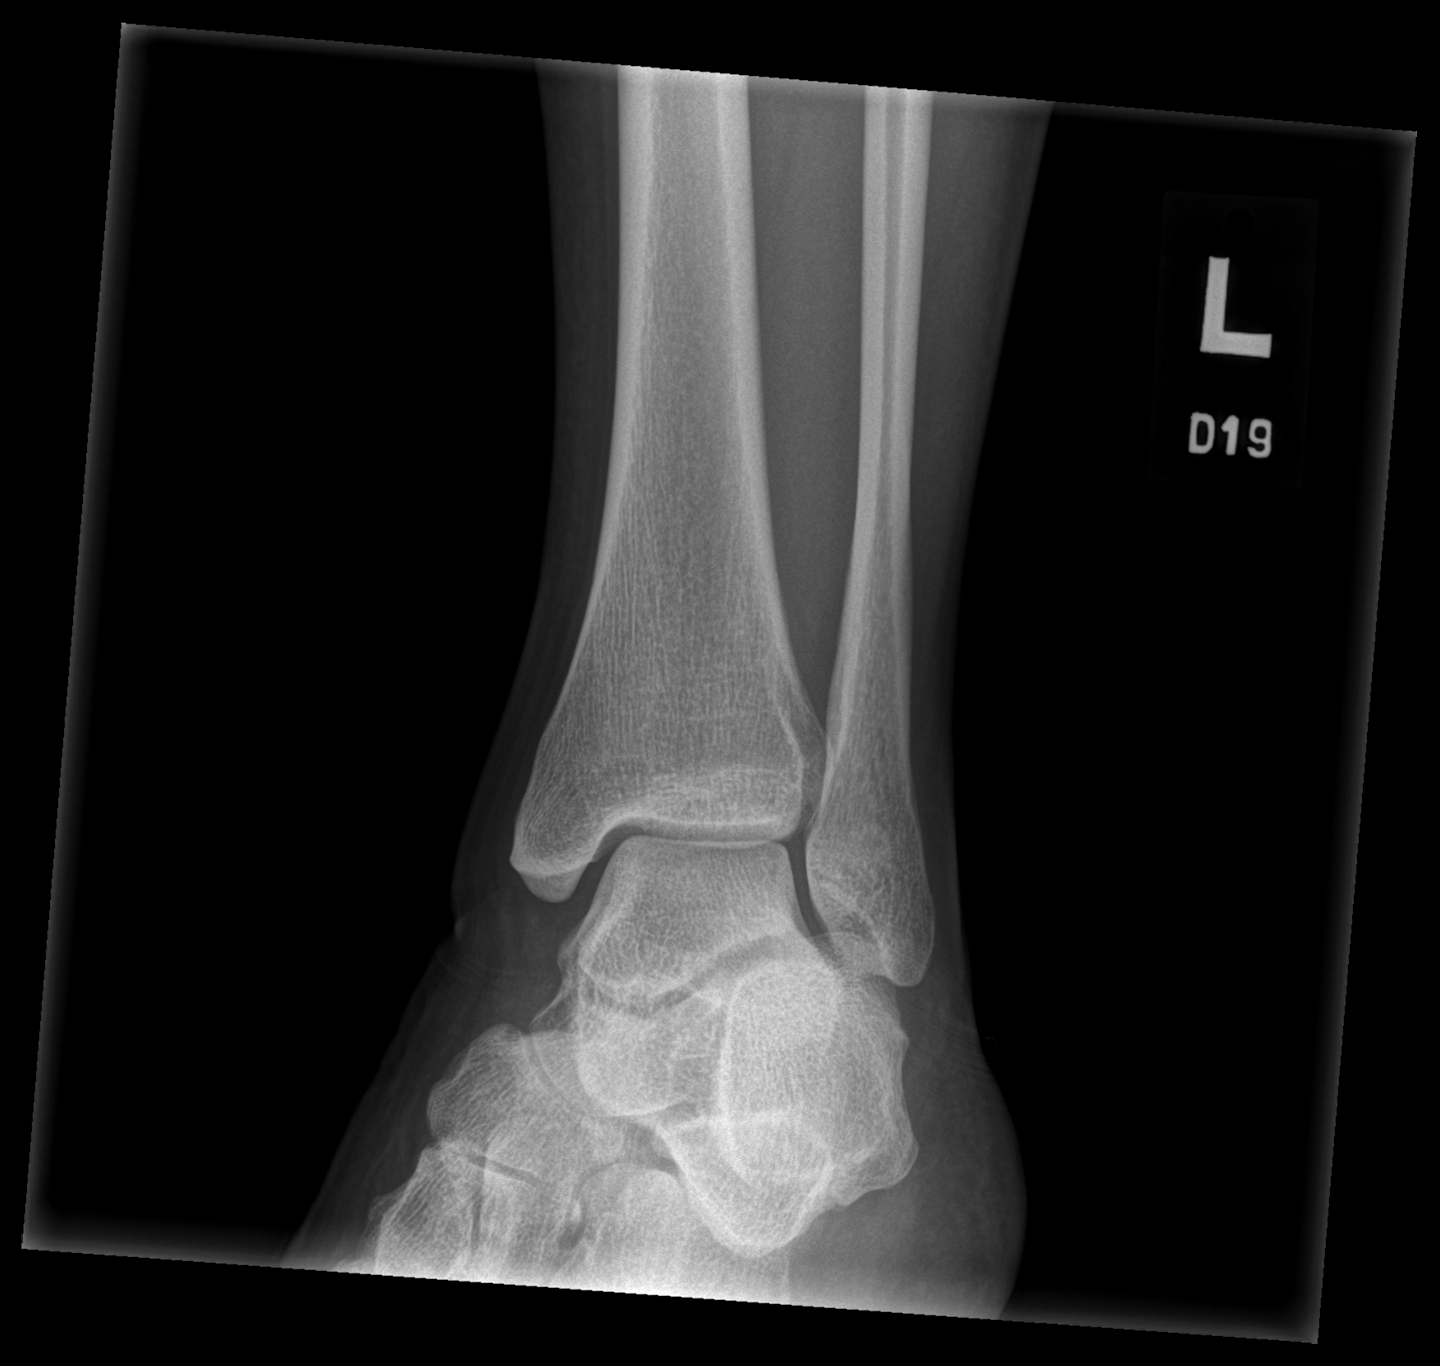

[x ankle lat left]
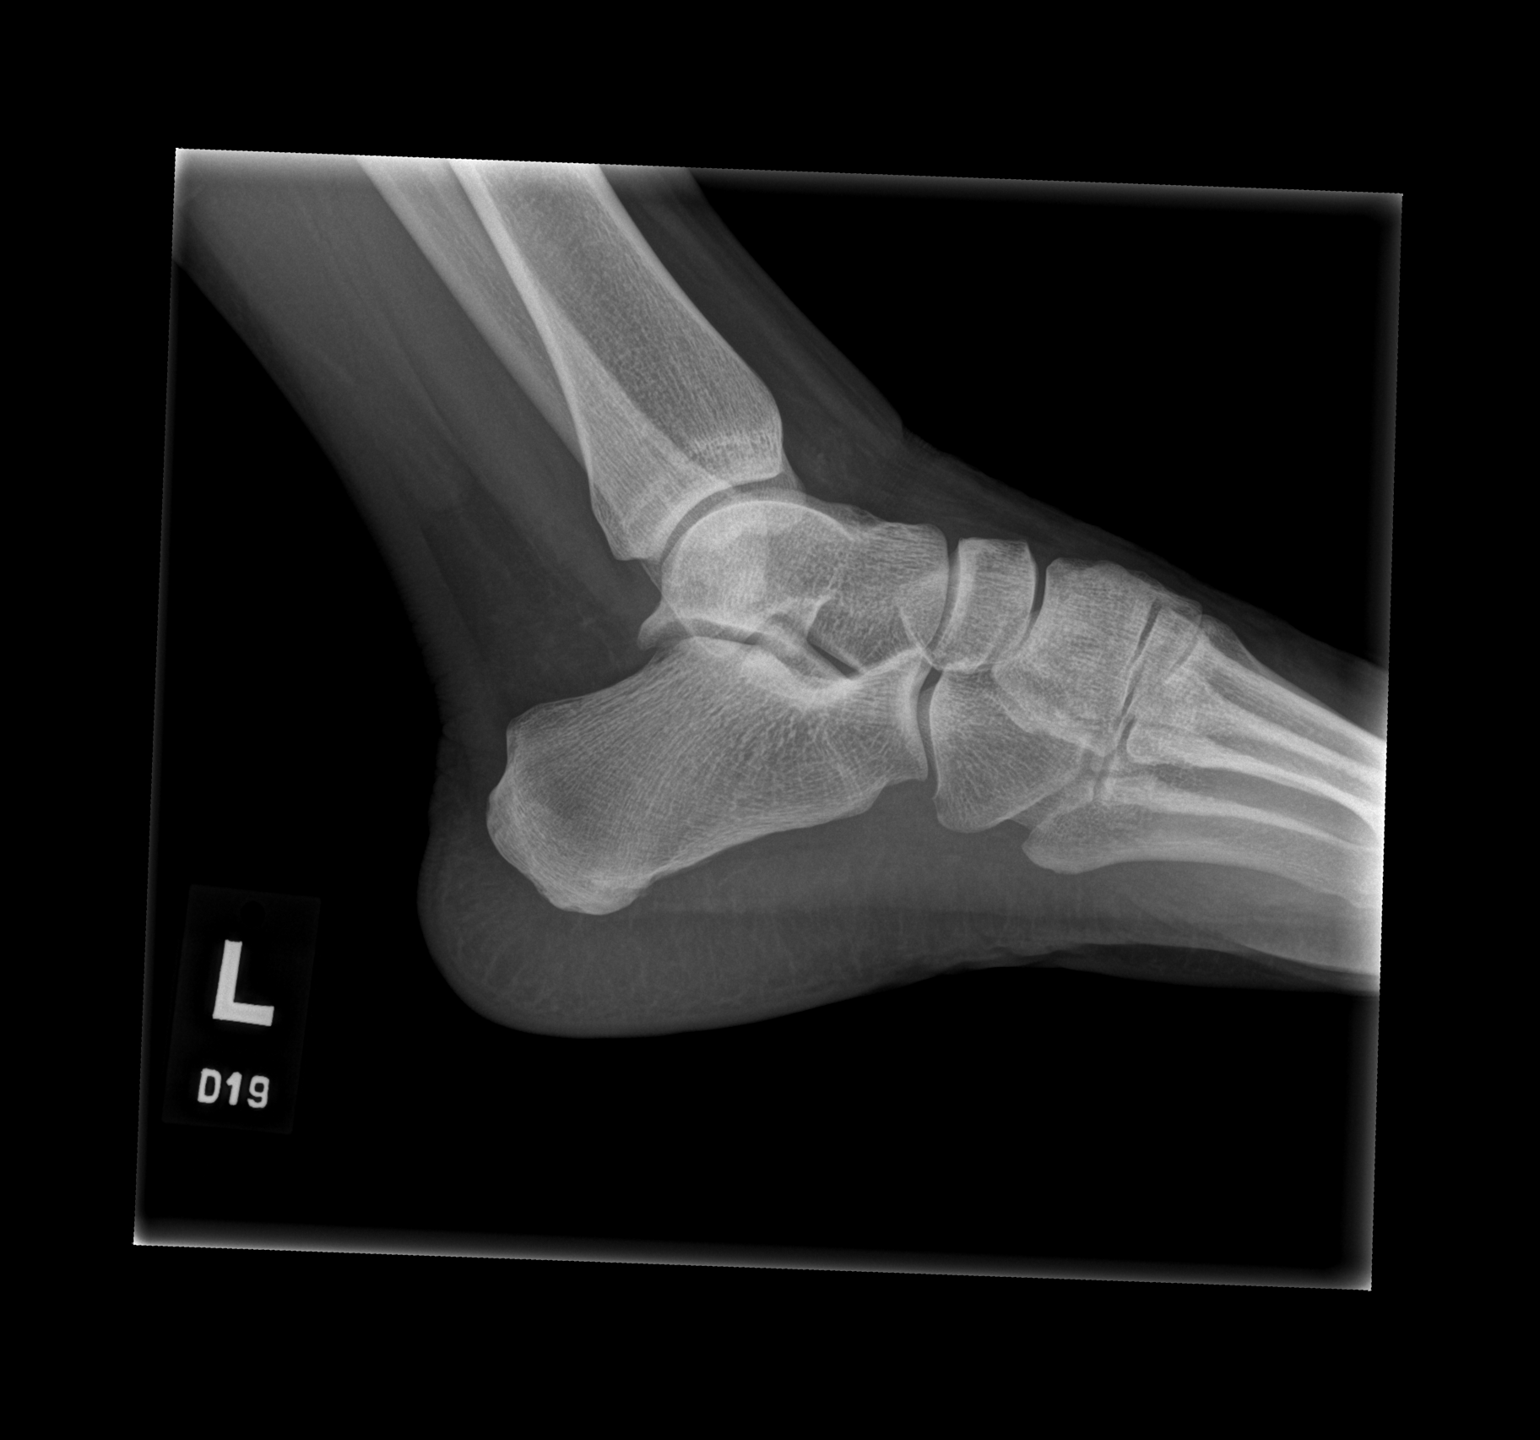

[3 of 3 positions shown; findings below may reference images not displayed]

FINDINGS: No fracture or dislocation. Joint spaces are preserved. The ankle
mortise is preserved. No ankle joint effusion. Regional soft tissues
appear normal. No radiopaque foreign body. No plantar calcaneal
spur.
IMPRESSION: No fracture.
